# Patient Record
Sex: Female | Born: 1990 | Race: Black or African American | Hispanic: No | Marital: Single | Smoking: Current every day smoker
Health system: Southern US, Community
[De-identification: ages and names within clinical notes are randomized; demographics above are authoritative.]

## PROBLEM LIST (undated history)

## (undated) DIAGNOSIS — N83201 Unspecified ovarian cyst, right side: Secondary | ICD-10-CM

## (undated) DIAGNOSIS — F419 Anxiety disorder, unspecified: Secondary | ICD-10-CM

## (undated) DIAGNOSIS — J45909 Unspecified asthma, uncomplicated: Secondary | ICD-10-CM

## (undated) DIAGNOSIS — F32A Depression, unspecified: Secondary | ICD-10-CM

## (undated) DIAGNOSIS — N83202 Unspecified ovarian cyst, left side: Secondary | ICD-10-CM

## (undated) DIAGNOSIS — F329 Major depressive disorder, single episode, unspecified: Secondary | ICD-10-CM

## (undated) DIAGNOSIS — G43719 Chronic migraine without aura, intractable, without status migrainosus: Secondary | ICD-10-CM

## (undated) HISTORY — PX: OTHER SURGICAL HISTORY: SHX169

## (undated) HISTORY — DX: Depression, unspecified: F32.A

## (undated) HISTORY — DX: Anxiety disorder, unspecified: F41.9

## (undated) HISTORY — DX: Major depressive disorder, single episode, unspecified: F32.9

## (undated) HISTORY — PX: WISDOM TOOTH EXTRACTION: SHX21

---
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
## 2014-04-23 ENCOUNTER — Encounter (HOSPITAL_COMMUNITY): Payer: Self-pay | Admitting: *Deleted

## 2014-04-23 ENCOUNTER — Emergency Department (HOSPITAL_COMMUNITY): Payer: Worker's Compensation

## 2014-04-23 ENCOUNTER — Emergency Department (HOSPITAL_COMMUNITY)
Admission: EM | Admit: 2014-04-23 | Discharge: 2014-04-23 | Disposition: A | Discharge status: Home / Self Care | Payer: Worker's Compensation | Attending: Emergency Medicine | Admitting: Emergency Medicine

## 2014-04-23 DIAGNOSIS — R112 Nausea with vomiting, unspecified: Secondary | ICD-10-CM | POA: Insufficient documentation

## 2014-04-23 DIAGNOSIS — M549 Dorsalgia, unspecified: Secondary | ICD-10-CM

## 2014-04-23 DIAGNOSIS — M545 Low back pain, unspecified: Secondary | ICD-10-CM

## 2014-04-23 DIAGNOSIS — Z3202 Encounter for pregnancy test, result negative: Secondary | ICD-10-CM | POA: Diagnosis not present

## 2014-04-23 DIAGNOSIS — J45909 Unspecified asthma, uncomplicated: Secondary | ICD-10-CM | POA: Diagnosis not present

## 2014-04-23 HISTORY — DX: Unspecified asthma, uncomplicated: J45.909

## 2014-04-23 LAB — URINALYSIS, ROUTINE W REFLEX MICROSCOPIC
Bilirubin Urine: NEGATIVE
Glucose, UA: NEGATIVE mg/dL
Hgb urine dipstick: NEGATIVE
Ketones, ur: NEGATIVE mg/dL
Leukocytes, UA: NEGATIVE
Nitrite: NEGATIVE
Protein, ur: NEGATIVE mg/dL
Specific Gravity, Urine: 1.023 (ref 1.005–1.030)
Urobilinogen, UA: 0.2 mg/dL (ref 0.0–1.0)
pH: 5.5 (ref 5.0–8.0)

## 2014-04-23 LAB — PREGNANCY, URINE: Preg Test, Ur: NEGATIVE

## 2014-04-23 MED ORDER — OXYCODONE-ACETAMINOPHEN 5-325 MG PO TABS
2.0000 | ORAL_TABLET | Freq: Once | ORAL | Status: AC
  Administered 2014-04-23: 2 via ORAL
  Filled 2014-04-23: qty 2

## 2014-04-23 NOTE — ED Notes (Signed)
The pt is c/o lower back pain n v since yesterday  lmp nov she may be oreg

## 2014-04-23 NOTE — ED Notes (Signed)
Patient transported to X-ray 

## 2014-04-23 NOTE — Discharge Instructions (Signed)
°Emergency Department Resource Guide °1) Find a Doctor and Pay Out of Pocket °Although you won't have to find out who is covered by your insurance plan, it is a good idea to ask around and get recommendations. You will then need to call the office and see if the doctor you have chosen will accept you as a new patient and what types of options they offer for patients who are self-pay. Some doctors offer discounts or will set up payment plans for their patients who do not have insurance, but you will need to ask so you aren't surprised when you get to your appointment. ° °2) Contact Your Local Health Department °Not all health departments have doctors that can see patients for sick visits, but many do, so it is worth a call to see if yours does. If you don't know where your local health department is, you can check in your phone book. The CDC also has a tool to help you locate your state's health department, and many state websites also have listings of all of their local health departments. ° °3) Find a Walk-in Clinic °If your illness is not likely to be very severe or complicated, you may want to try a walk in clinic. These are popping up all over the country in pharmacies, drugstores, and shopping centers. They're usually staffed by nurse practitioners or physician assistants that have been trained to treat common illnesses and complaints. They're usually fairly quick and inexpensive. However, if you have serious medical issues or chronic medical problems, these are probably not your best option. ° °No Primary Care Doctor: °- Call [facility] at  832-8000 - they can help you locate a primary care doctor that  accepts your insurance, provides certain services, etc. °- Physician Referral Service- 1-800-533-3463 ° °Chronic Pain Problems: °Organization         Address  Phone   Notes  °[facility]  (336) 297-2271 Patients need to be referred by their primary care doctor.  ° °Medication  Assistance: °Organization         Address  Phone   Notes  °[place] [facility] [address] °[place], [place] [address] (336) 641-8030 --Must be a resident of [place] °-- Must have NO insurance coverage whatsoever (no Medicaid/ Medicare, etc.) °-- The pt. MUST have a primary care doctor that directs their care regularly and follows them in the community °  °[facility]  (866) 331-1348   °[employer]  (888) 892-1162   ° °Agencies that provide inexpensive medical care: °Organization         Address  Phone   Notes  °[facility]  (336) 832-8035   °[facility]    (336) 832-7272   °[facility] [address] °[place], [place] [address] (336) 832-4777   °[facility] [address], °[place] (336) 271-4999   °[facility]    (336) 373-0678   °[facility]    (336) 272-1050   °[facility] ° [address], [place] Phone:  (336) 832-4444, Fax:  (336) 832-4440 Hours of Operation:  9 am - 6 pm, M-F.  Also accepts Medicaid/Medicare and self-pay.  °[facility] ° [address], [place] Phone: (336) 832-3150, Fax: (336) 832-3151. Hours of Operation:  8:30 am - 5:30 pm, M-F.  Also accepts Medicaid and self-pay.  °[facility] [place] [address], [place] Phone: (336) 878-6027   °[facility] [address], [place], [place] (336)723-1848, Ext. 123 Mondays & Thursdays: 7-9 AM.  First 15 patients are seen on a first come, first serve basis. °  ° °Medicaid-accepting [place] Providers: ° °Organization         Address  Phone   Notes  °[facility] [address], [place] (336) 641-2100 Also accepts self-pay patients.  °[facility] [address], [place] ° (336) 856-9996   °[facility] [address], [place]  (336) 288-8857   °[facility] [address] [place] [address], [place] (336) 299-7000   °Veita Bland [address], [place]  ° (336) 373-1557 Only accepts [place] Access Medicaid patients after they have their name applied to their card.  ° °Self-Pay (no insurance) in [place]: ° °Organization         Address  Phone   Notes  °Sickle Cell Patients, [facility] [address], [place] (336) 832-1970   °[facility] [address], [place] (336) 832-4400   °[facility] ° [address], [place] (336) 992-4800   °[facility]/Dr. Osei-Bonsu ° [address] [place] [address], [place] or [address], [place] (336) 841-8500 Phone number for both [place] and [place] locations is the same.  °[facility] [address], [place] (336) 299-0000   °[facility] [address] [place] [address], [place] or [address] (336) 852-7530 °(336) 878-2260   °[facility] [address], [place] (336) 350-1642, phone; (336) 294-5005, fax Sees patients 1st and 3rd Saturday of every month.  Must not qualify for public or private insurance (i.e. Medicaid, Medicare, [place] Health Choice, Veterans' Benefits) • Household income should be no more than 200% of the poverty level •The clinic cannot treat you if you are pregnant or think you are pregnant • Sexually transmitted diseases are not treated at the clinic.  ° ° °Dental Care: °Organization         Address  Phone  Notes  °[place] [facility] [address], [place] (336) 641-6152 Accepts children up to age 21 who are enrolled in Medicaid or [place] Health Choice; pregnant women with a Medicaid card; and children who have applied for Medicaid or [place] Health Choice, but were declined, whose parents can pay a reduced fee at time of service.  °[place]  [facility] [place]  [address], [place] (336) 641-7733 Accepts children up to age 21 who are enrolled in Medicaid or [place] Health Choice; pregnant women with a Medicaid card; and children who have applied for Medicaid or [place] Health Choice, but were declined, whose parents can pay a reduced fee at time of service.  °[facility] ° [address], [place] (336) 641-4533 Patients are seen by appointment only. Walk-ins are not accepted. [facility] will see patients 18 years of age and older. °Monday - Tuesday (8am-5pm) °Most Wednesdays (8:30-5pm) °$30 per visit, cash only  °[facility] ° [address], [place] (336) 641-4533 Patients are seen by appointment only. Walk-ins are not accepted. [facility] will see patients 18 years of age and older. °One   Wednesday Evening (Monthly: Volunteer Based).  $30 per visit, cash only  °[facility]  (919) 537-3737 for adults; Children under age 4, call Graduate Pediatric Dentistry at (919) [phone]. Children aged 4-14, please call (919) [phone] to request a pediatric application. ° Dental services are provided in all areas of dental care including fillings, crowns and bridges, complete and partial dentures, implants, gum treatment, root canals, and extractions. Preventive care is also provided. Treatment is provided to both adults and children. °Patients are selected via a lottery and there is often a waiting list. °  °[facility] [address], °[place] ° (336) 763-8833 [email or web address] °  °[facility] [address], [place], [place] (336)723-1848, Ext. 123 Second and Fourth Thursday of each month, opens at 6:30 AM; Clinic ends at 9 AM.  Patients are seen on a first-come first-served basis, and a limited number are seen during each clinic.  ° °[facility] ° [address], [place], [place] (336) 723-7904    Eligibility Requirements °You must have lived in [place], [place], or [place] counties for at least the last three months. °  You cannot be eligible for state or federal sponsored healthcare insurance, including Veterans Administration, Medicaid, or Medicare. °  You generally cannot be eligible for healthcare insurance through your employer.  °  How to apply: °Eligibility screenings are held every Tuesday and Wednesday afternoon from 1:00 pm until 4:00 pm. You do not need an appointment for the interview!  °[facility] [address], [place], [place] 336-631-2330   °[facility]  336-342-8273   °[facility]  336-703-3100   °[facility]  336-570-6415   ° °Behavioral Health Resources in the Community: °Intensive Outpatient Programs °Organization         Address  Phone  Notes  °[facility] [address], [place], [place] 336-878-6098   °[facility] [address], [place], [place] 336-832-9800   °[facility] [address], [place], [place] ° 336-882-2125   °[facility] [address],  °[place], [place] 1-800-853-5163 or 336-641-4981   °Substance Abuse Resources °Organization         Address  Phone  Notes  °[facility]  336-882-2125   °[facility]  336-784-9470   °[facility]  336-285-9073   °[facility]  336-845-3988   °Residential & Outpatient Substance Abuse Program  1-800-659-3381   °Psychological Services °Organization         Address  Phone  Notes  °[facility]  336- 832-9600   °[facility]  336- 378-7881   °[facility] [address], [place] 1-800-853-5163 or 336-641-4981   ° °Mobile Crisis Teams °Organization         Address  Phone  Notes  °[facility], Mobile Crisis Care Unit  1-877-626-1772   °Assertive °[facility] ° [address]  [place], [place] 336-834-9664   °Sharon DeEsch [address] °[place] 336-554-5454   ° °Self-Help/Support Groups °Organization         Address  Phone             Notes  °[facility]  - variety of support groups  336- 373-1402 Call for more information  °Narcotics Anonymous (NA), [facility] [address], °[place]  2 meetings at this location  ° °  Residential Treatment Programs °Organization         Address  Phone  Notes  °[facility] [address],    °[place]  1-866-801-8205   °[facility] ° [address], [place], [place] 704-293-8524   °[facility] [address], [place] 336-845-3988 Admissions: 8am-3pm M-F  °[facility] [address]    °[place], [place] 336-841-1104   °[facility] [address], [place], [place] 336-379-7146   °[facility] [address]  °[place], [place] 336-285-9073   °[facility] Intensive Outpatient [address], [place], [place] 336-852-3033   °[facility] [address]  °[place], [place] 1-877-615-2722 or 336-784-9470   °[facility] [address] [place], [place] 336-227-7417 Accepts Medicaid  °[facility] [address]  °[place] 1-800-659-3381 Substance Abuse/Addiction Treatment  ° °[place] Behavioral Health Resources °Organization         Address  Phone  Notes  °[facility]  (888) 581-9988   °Julie Brannon, PhD [address] [place], [place]   (336) 349-5553 or (336) 951-0000   °[facility]   [address] °[place], [place] (336) 349-4454   °[facility] [address], [place], [place] (336) 342-8316 Insurance/Medicaid/sponsorship through [facility]  °[facility] [address]                                    [place], [place] (336) 342-8316 Therapy/tele-psych/case    °[facility] [address]  ° [place], [place] (336) 349-2233    °Dr. Arfeen  (336) 349-4544   °[facility] [place]  [employer] [place] [facility] 1) [address],  °2) [address], [place] °3)  [address], [place] (336) 349-3220 °(336) 342-7768 ° °(336) 342-8140   °[place] [facility] (336) 342-1394 or (336) 342-3537 (After Hours)    ° ° ° °Take your usual prescriptions as previously directed.  Apply moist heat or ice to the area(s) of discomfort, for 15 minutes at a time, several times per day for the next few days.  Do not fall asleep on a heating or ice pack.  Call your regular medical doctor on Monday to schedule a follow up appointment in the next 2 days.  Return to the Emergency Department immediately if worsening. ° °

## 2014-04-23 NOTE — ED Provider Notes (Signed)
CSN: [id number]     Arrival date & time 04/23/14  0256 History   First MD Initiated Contact with Patient 04/23/14 [phone]     Chief Complaint  Patient presents with  . Back Pain      HPI Pt was seen at 0345. Per pt, c/o gradual onset and persistence of constant right sided low back "pain" for the past 3 days. 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 Pt was evaluated by her Occupational Health MD, rx muscle relaxer, anti-inflammatory, and narcotic pain medication. Pain worsens with palpation of the area and body position changes. Pt states the right side of her lower back "feels numb" and she had an episode of N/V yesterday, so she came to the ED for further evaluation. Pt states she "might be pregnant" because her LMP was 3 months ago. Pt has not taken a home pregnancy test. Denies incont/retention of bowel or bladder, no saddle anesthesia, no focal motor weakness, no tingling/numbness in extremities, no fevers, no new injury, no abd pain.     Past Medical History  Diagnosis Date  . Asthma    History reviewed. No pertinent past surgical history.  History  Substance Use Topics  . Smoking status: Never Smoker   . Smokeless tobacco: Not on file  . Alcohol Use: No    Review of Systems ROS: Statement: All systems negative except as marked or noted in the HPI; Constitutional: Negative for fever and chills. ; ; Eyes: Negative for eye pain, redness and discharge. ; ; ENMT: Negative for ear pain, hoarseness, nasal congestion, sinus pressure and sore throat. ; ; Cardiovascular: Negative for chest pain, palpitations, diaphoresis, dyspnea and peripheral edema. ; ; Respiratory: Negative for cough, wheezing and stridor. ; ; Gastrointestinal: +N/V. Negative for diarrhea, abdominal pain, blood in stool, hematemesis, jaundice and rectal bleeding. . ; ; Genitourinary: Negative for dysuria, flank pain and hematuria. ; ; Musculoskeletal: +LBP. Negative for neck pain.  Negative for swelling and trauma.; ; Skin: Negative for pruritus, rash, abrasions, blisters, bruising and skin lesion.; ; Neuro: Negative for headache, lightheadedness and neck stiffness. Negative for weakness, altered level of consciousness , altered mental status, extremity weakness, involuntary movement, seizure and syncope.     Allergies  Review of patient's allergies indicates no known allergies.  Home Medications   Prior to Admission medications   Not on File   BP 116/73 mmHg  Pulse 65  Temp(Src) 98.3 F (36.8 C) (Oral)  Resp 22  Ht 5\' 9"  (1.753 m)  Wt 240 lb (108.863 kg)  BMI 35.43 kg/m2  SpO2 100%  LMP 02/10/2014 Physical Exam  0350; Physical examination:  Nursing notes reviewed; Vital signs and O2 SAT reviewed;  Constitutional: Well developed, Well nourished, Well hydrated, In no acute distress; Head:  Normocephalic, atraumatic; Eyes: EOMI, PERRL, No scleral icterus; ENMT: Mouth and pharynx normal, Mucous membranes moist; Neck: Supple, Full range of motion, No lymphadenopathy; Cardiovascular: Regular rate and rhythm, No murmur, rub, or gallop; Respiratory: Breath sounds clear & equal bilaterally, No rales, rhonchi, wheezes.  Speaking full sentences with ease, Normal respiratory effort/excursion; Chest: Nontender, Movement normal; Abdomen: Soft, Nontender, Nondistended, Normal bowel sounds; Genitourinary: No CVA tenderness; Spine:  No midline CS, TS, LS tenderness. +TTP right lumbar paraspinal muscles. No rash.;; Extremities: Pulses normal, No tenderness, No edema, No calf edema or asymmetry.; Neuro: AA&Ox3, Major CN grossly intact.  Speech clear. No gross focal motor or sensory deficits in extremities. Strength 5/5  equal bilat UE's and LE's, including great toe dorsiflexion.  DTR 2/4 equal bilat UE's and LE's.  No gross sensory deficits.  Neg straight leg raises bilat.; Skin: Color normal, Warm, Dry.   ED Course  Procedures     EKG Interpretation None      MDM   MDM Reviewed: nursing note and vitals Interpretation: x-ray and labs      Results for orders placed or performed during the hospital encounter of 04/23/14  Pregnancy, urine  Result Value Ref Range   Preg Test, Ur NEGATIVE NEGATIVE  Urinalysis, Routine w reflex microscopic  Result Value Ref Range   Color, Urine YELLOW YELLOW   APPearance CLEAR CLEAR   Specific Gravity, Urine 1.023 1.005 - 1.030   pH 5.5 5.0 - 8.0   Glucose, UA NEGATIVE NEGATIVE mg/dL   Hgb urine dipstick NEGATIVE NEGATIVE   Bilirubin Urine NEGATIVE NEGATIVE   Ketones, ur NEGATIVE NEGATIVE mg/dL   Protein, ur NEGATIVE NEGATIVE mg/dL   Urobilinogen, UA 0.2 0.0 - 1.0 mg/dL   Nitrite NEGATIVE NEGATIVE   Leukocytes, UA NEGATIVE NEGATIVE   Dg Lumbar Spine Complete 04/23/2014   CLINICAL DATA:  Bowel back popped while pushing resident in a stretcher. Pain radiates to RIGHT leg and RIGHT arm.  EXAM: LUMBAR SPINE - COMPLETE 4+ VIEW  COMPARISON:  None.  FINDINGS: Five non rib-bearing lumbar-type vertebral bodies are intact and aligned with maintenance of the lumbar lordosis. Intervertebral disc heights are normal. 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 No pars interarticularis defects. No destructive bony lesions.  Sacroiliac joints are symmetric. Included prevertebral and paraspinal soft tissue planes are non-suspicious.  IMPRESSION: Negative.   Electronically Signed   By: Awilda Metro   On: 04/23/2014 05:31    4098:  Workup reassuring. No red flags on HPI or PE. Feels better after pain meds and wants to go home now. States she "has enough" prescriptions at home and does not need any more meds. Dx and testing d/w pt and family.  Questions answered.  Verb understanding, agreeable to d/c home with outpt f/u.   Samuel Jester, DO 04/26/14 1308

## 2015-12-25 ENCOUNTER — Encounter (HOSPITAL_COMMUNITY): Payer: Self-pay

## 2015-12-25 ENCOUNTER — Emergency Department (HOSPITAL_COMMUNITY)
Admission: EM | Admit: 2015-12-25 | Discharge: 2015-12-25 | Disposition: A | Discharge status: Home / Self Care | Payer: Self-pay | Attending: Emergency Medicine | Admitting: Emergency Medicine

## 2015-12-25 DIAGNOSIS — K029 Dental caries, unspecified: Secondary | ICD-10-CM | POA: Insufficient documentation

## 2015-12-25 DIAGNOSIS — K0889 Other specified disorders of teeth and supporting structures: Secondary | ICD-10-CM

## 2015-12-25 DIAGNOSIS — J45909 Unspecified asthma, uncomplicated: Secondary | ICD-10-CM | POA: Insufficient documentation

## 2015-12-25 MED ORDER — HYDROCODONE-ACETAMINOPHEN 5-325 MG PO TABS
1.0000 | ORAL_TABLET | Freq: Once | ORAL | Status: AC
Start: 2015-12-25 — End: 2015-12-25
  Administered 2015-12-25: 1 via ORAL
  Filled 2015-12-25: qty 1

## 2015-12-25 MED ORDER — BENZOCAINE 10 % MT GEL: 1.0000 "application " | Freq: Four times a day (QID) | OROMUCOSAL | 0 refills | Status: DC | PRN

## 2015-12-25 MED ORDER — PENICILLIN V POTASSIUM 250 MG PO TABS
500.0000 mg | ORAL_TABLET | Freq: Once | ORAL | Status: AC
  Administered 2015-12-25: 500 mg via ORAL
  Filled 2015-12-25: qty 2

## 2015-12-25 MED ORDER — PENICILLIN V POTASSIUM 500 MG PO TABS: 500.0000 mg | ORAL_TABLET | Freq: Four times a day (QID) | ORAL | 0 refills | Status: AC

## 2015-12-25 NOTE — Discharge Instructions (Signed)
Read the information below.  You are being prescribed an antibiotic. Please take as directed.  I also prescribed Orajel. You can apply topically to the affected tooth up to 4 times daily.  Continue to take either Tylenol 650 mg every 6 hours or Motrin for her milligrams every 6 hours for pain relief. You can apply ice pack to affected area for 20 minute increments.  I provided the contact information for dentists. Please call in the morning to schedule a follow-up appointment. Use the prescribed medication as directed.  Please discuss all new medications with your pharmacist.   You may return to the Emergency Department at any time for worsening condition or any new symptoms that concern you. Return to the emergency department if you develop a fever, trouble swallowing, trouble breathing or unable to open your mouth.

## 2015-12-25 NOTE — ED Triage Notes (Signed)
Patient here with right lower dental pain x 1 week that has worsened the past 2 days, pain radiating alomg jaw line to right ear

## 2015-12-25 NOTE — ED Provider Notes (Signed)
[facility]-EMERGENCY DEPT Provider Note   CSN: [id number] Arrival date & time: 12/25/15  1826  By signing my name below, I, Nelwyn Salisbury, attest that this documentation has been prepared under the direction and in the presence of non-physician practitioner, Arvilla Meres, PA-C.Marland Kitchen Electronically Signed: Nelwyn Salisbury, Scribe. 12/27/2015. 5:33 PM.  History   Chief Complaint Chief Complaint  Patient presents with  . Dental Pain    The history is provided by the patient. No language interpreter was used.    HPI Comments:  Kayla Owens is a 25 y.o. female who presents to the Emergency Department complaining of sudden-onset constant unchanged right-lower dental pain beginning 2 weeks ago. The pt describes her symptoms as a pulsating pain that radiates up to her ear. Pt has tried tylenol, excedrin, and naproxen with no relief. Her pain is exacerbated by eating. She endorses associated nausea, sinus pressure when laying back, facial swelling. She denies any fever, neck pain, trouble swallowing, headache, or shortness of breath.   Past Medical History:  Diagnosis Date  . Asthma     There are no active problems to display for this patient.   History reviewed. No pertinent surgical history.  OB History    No data available       Home Medications    Prior to Admission medications   Medication Sig Start Date End Date Taking? Authorizing Provider  benzocaine (ORAJEL) 10 % mucosal gel Use as directed 1 application in the mouth or throat 4 (four) times daily as needed for mouth pain. 12/25/15   Lona Kettle, PA-C  penicillin v potassium (VEETID) 500 MG tablet Take 1 tablet (500 mg total) by mouth 4 (four) times daily. 12/25/15 01/01/16  Lona Kettle, PA-C    Family History No family history on file.  Social History Social History  Substance Use Topics  . Smoking status: Never Smoker  . Smokeless tobacco: Never Used  . Alcohol use No     Allergies   Review of patient's  allergies indicates no known allergies.   Review of Systems Review of Systems  Constitutional: Negative for fever.  HENT: Positive for dental problem, facial swelling and sinus pressure. Negative for trouble swallowing.   Respiratory: Negative for shortness of breath.   Gastrointestinal: Positive for nausea.  Musculoskeletal: Negative for neck pain.  Neurological: Negative for headaches.     Physical Exam Updated Vital Signs BP 144/95 (BP Location: Left Arm) Comment (BP Location): BP taken in pt's forearm  Pulse 70   Temp 98.2 F (36.8 C) (Oral)   Resp 18   SpO2 100%   Physical Exam  Constitutional: She appears well-developed and well-nourished. No distress.  HENT:  Head: Normocephalic and atraumatic.  Right Ear: Tympanic membrane, external ear and ear canal normal.  Left Ear: Tympanic membrane, external ear and ear canal normal.  Nose: Nose normal.  Mouth/Throat: Uvula is midline, oropharynx is clear and moist and mucous membranes are normal. No trismus in the jaw. Abnormal dentition. Dental caries present. No uvula swelling. No tonsillar exudate.  No obvious facial swelling appreciated. Disrupted filling at tooth 29. TTP of tooth 29 with associated TTP of gingiva. No trismus. No sublingual pain or swelling. No obvious abscess. Patient managing oral secretions.   Eyes: Conjunctivae are normal. Right eye exhibits no discharge. Left eye exhibits no discharge. No scleral icterus.  Neck: Normal range of motion and phonation normal. Neck supple. No neck rigidity.  No nuchal rigidity.   Cardiovascular: Normal rate and intact distal  pulses.   Pulmonary/Chest: Effort normal. No stridor. No respiratory distress.  Abdominal: Soft. There is no tenderness.  Musculoskeletal: She exhibits no edema.  Lymphadenopathy:    She has no cervical adenopathy.  Neurological: She is alert.  Skin: Skin is warm and dry. She is not diaphoretic.  Psychiatric: She has a normal mood and affect. Her  behavior is normal.  Nursing note and vitals reviewed.  ED Treatments / Results  DIAGNOSTIC STUDIES:  Oxygen Saturation is 96% on RA, normal by my interpretation.    COORDINATION OF CARE:  9:21 PM Discussed treatment plan with pt at bedside which includes abx, OTC painkillers, and benzocaine gel and pt agreed to plan.  Labs (all labs ordered are listed, but only abnormal results are displayed) Labs Reviewed - No data to display  EKG  EKG Interpretation None       Radiology No results found.  Procedures Procedures (including critical care time)  Medications Ordered in ED Medications  penicillin v potassium (VEETID) tablet 500 mg (500 mg Oral Given 12/25/15 2145)  HYDROcodone-acetaminophen (NORCO/VICODIN) 5-325 MG per tablet 1 tablet (1 tablet Oral Given 12/25/15 2145)     Initial Impression / Assessment and Plan / ED Course  I have reviewed the triage vital signs and the nursing notes.  Pertinent labs & imaging results that were available during my care of the patient were reviewed by me and considered in my medical decision making (see chart for details).  Clinical Course    Patient presents to ED with dental pain. Patient is afebrile and non-toxic appearing in NAD. VSS. Disrupted filling at tooth 29 with associated TTP and TTP of underlying gingiva. No trismus. Uvula midline. No sublingual swelling or TTP. No nuchal rigidity. Pt managing oral secretions. Patient with toothache.  No gross abscess.  Exam unconcerning for Ludwig's angina or spread of infection.  Pain managed in ED. Rx PCN and oragel. Symptomatic management discussed. Urged patient to follow-up with dentist, contact information provided. Return precautions given. Pt voiced understanding and is agreeable.    Final Clinical Impressions(s) / ED Diagnoses   Final diagnoses:  Pain, dental    New Prescriptions Discharge Medication List as of 12/25/2015  9:35 PM    START taking these medications   Details    benzocaine (ORAJEL) 10 % mucosal gel Use as directed 1 application in the mouth or throat 4 (four) times daily as needed for mouth pain., Starting Tue 12/25/2015, Print    penicillin v potassium (VEETID) 500 MG tablet Take 1 tablet (500 mg total) by mouth 4 (four) times daily., Starting Tue 12/25/2015, Until Tue 01/01/2016, Print      I personally performed the services described in this documentation, which was scribed in my presence. The recorded information has been reviewed and is accurate.     Herminio Commonsshley Laurel Sulphur SpringsMeyer, [place] JerseyPA-C 12/27/15 1738    Alvira MondayErin Schlossman, MD 12/30/15 2117

## 2016-03-17 DIAGNOSIS — Z113 Encounter for screening for infections with a predominantly sexual mode of transmission: Secondary | ICD-10-CM | POA: Diagnosis not present

## 2016-03-17 DIAGNOSIS — Z01411 Encounter for gynecological examination (general) (routine) with abnormal findings: Secondary | ICD-10-CM | POA: Diagnosis not present

## 2016-03-17 DIAGNOSIS — Z114 Encounter for screening for human immunodeficiency virus [HIV]: Secondary | ICD-10-CM | POA: Diagnosis not present

## 2016-03-17 DIAGNOSIS — Z1159 Encounter for screening for other viral diseases: Secondary | ICD-10-CM | POA: Diagnosis not present

## 2016-03-17 DIAGNOSIS — Z118 Encounter for screening for other infectious and parasitic diseases: Secondary | ICD-10-CM | POA: Diagnosis not present

## 2016-03-17 DIAGNOSIS — N971 Female infertility of tubal origin: Secondary | ICD-10-CM | POA: Diagnosis not present

## 2016-03-17 DIAGNOSIS — Z6841 Body Mass Index (BMI) 40.0 and over, adult: Secondary | ICD-10-CM | POA: Diagnosis not present

## 2016-03-27 DIAGNOSIS — G43839 Menstrual migraine, intractable, without status migrainosus: Secondary | ICD-10-CM | POA: Diagnosis not present

## 2016-03-27 DIAGNOSIS — Z049 Encounter for examination and observation for unspecified reason: Secondary | ICD-10-CM | POA: Diagnosis not present

## 2016-03-27 DIAGNOSIS — Z79899 Other long term (current) drug therapy: Secondary | ICD-10-CM | POA: Diagnosis not present

## 2016-03-27 DIAGNOSIS — G43719 Chronic migraine without aura, intractable, without status migrainosus: Secondary | ICD-10-CM | POA: Diagnosis not present

## 2016-03-27 DIAGNOSIS — R51 Headache: Secondary | ICD-10-CM | POA: Diagnosis not present

## 2016-03-27 DIAGNOSIS — G43111 Migraine with aura, intractable, with status migrainosus: Secondary | ICD-10-CM | POA: Diagnosis not present

## 2016-06-23 DIAGNOSIS — N971 Female infertility of tubal origin: Secondary | ICD-10-CM | POA: Diagnosis not present

## 2016-06-23 DIAGNOSIS — N7011 Chronic salpingitis: Secondary | ICD-10-CM | POA: Diagnosis not present

## 2016-06-23 DIAGNOSIS — N736 Female pelvic peritoneal adhesions (postinfective): Secondary | ICD-10-CM | POA: Diagnosis not present

## 2016-06-23 DIAGNOSIS — Z319 Encounter for procreative management, unspecified: Secondary | ICD-10-CM | POA: Diagnosis not present

## 2016-06-23 DIAGNOSIS — Z3161 Procreative counseling and advice using natural family planning: Secondary | ICD-10-CM | POA: Diagnosis not present

## 2016-09-17 ENCOUNTER — Ambulatory Visit (HOSPITAL_COMMUNITY)
Admission: EM | Admit: 2016-09-17 | Discharge: 2016-09-17 | Disposition: A | Discharge status: Home / Self Care | Payer: 59 | Attending: Family Medicine | Admitting: Family Medicine

## 2016-09-17 ENCOUNTER — Encounter (HOSPITAL_COMMUNITY): Payer: Self-pay | Admitting: Emergency Medicine

## 2016-09-17 DIAGNOSIS — Z79899 Other long term (current) drug therapy: Secondary | ICD-10-CM | POA: Insufficient documentation

## 2016-09-17 DIAGNOSIS — N898 Other specified noninflammatory disorders of vagina: Secondary | ICD-10-CM | POA: Diagnosis not present

## 2016-09-17 MED ORDER — METRONIDAZOLE 500 MG PO TABS: 500.0000 mg | ORAL_TABLET | Freq: Two times a day (BID) | ORAL | 0 refills | Status: AC

## 2016-09-17 NOTE — ED Triage Notes (Signed)
[facility] a complaint of a vaginal discharge and odor after unprotected sex 2 weeks ago.

## 2016-09-17 NOTE — ED Provider Notes (Signed)
CSN: [id number]     Arrival date & time 09/17/16  1859 History     Chief Complaint  Patient presents with  . Vaginal Discharge    Patient is a 26 yo female with 4-5 day history of vaginal discharge with odor. States she had unprotected sex 2 weeks ago with her monogamous partner. She had her cycle shortly after. She thought discharge was due to her cycle when it first started. With increase in yellow/off white discharge and a foul odor, she would like to be tested for STDs. Denies vaginal pain, itching, irritation. Denies fever, abdominal pain, changes in bowel movement. Denies burning on urination, urinary frequency.        Past Medical History:  Diagnosis Date  . Asthma   . Hx of migraines    History reviewed. No pertinent surgical history. History reviewed. No pertinent family history. Social History  Substance Use Topics  . Smoking status: Never Smoker  . Smokeless tobacco: Never Used  . Alcohol use No   OB History    No data available     Review of Systems  Constitutional: Negative for chills, diaphoresis and fever.  Respiratory: Negative for cough, shortness of breath and wheezing.   Cardiovascular: Negative for chest pain and palpitations.  Gastrointestinal: Negative for abdominal distention, abdominal pain, blood in stool, constipation, diarrhea, nausea and vomiting.  Genitourinary: Positive for vaginal discharge. Negative for difficulty urinating, dyspareunia, dysuria, flank pain, frequency, genital sores, menstrual problem, pelvic pain, urgency, vaginal bleeding and vaginal pain.    Allergies  Patient has no known allergies.  Home Medications   Prior to Admission medications   Medication Sig Start Date End Date Taking? Authorizing Provider  baclofen (LIORESAL) 10 MG tablet Take 10 mg by mouth 3 (three) times daily.   Yes [provider]  tiZANidine (ZANAFLEX) 4 MG tablet Take 4 mg by mouth every 6 (six) hours as needed for muscle spasms.   Yes  [provider]  metroNIDAZOLE (FLAGYL) 500 MG tablet Take 1 tablet (500 mg total) by mouth 2 (two) times daily. 09/17/16 09/24/16  Belinda Fisher, PA-C   Meds Ordered and Administered this Visit  Medications - No data to display  BP (!) 147/71 (BP Location: Right Arm)   Temp 97.6 F (36.4 C) (Oral)   Resp 18   LMP 09/11/2016 (Exact Date)   SpO2 100%  No data found.   Physical Exam  Constitutional: She is oriented to person, place, and time. She appears well-developed and well-nourished. No distress.  HENT:  Head: Normocephalic and atraumatic.  Eyes: Conjunctivae are normal. Pupils are equal, round, and reactive to light.  Neck: Neck supple. No thyromegaly present.  Cardiovascular: Normal rate and regular rhythm.  Exam reveals no gallop and no friction rub.   No murmur heard. Pulmonary/Chest: Effort normal and breath sounds normal. She has no wheezes.  Abdominal: Soft. Bowel sounds are normal. She exhibits no distension and no mass. There is no tenderness. There is no rebound and no guarding.  Neurological: She is alert and oriented to person, place, and time.  Skin: Skin is warm and dry.  Psychiatric: She has a normal mood and affect. Her behavior is normal. Judgment normal.    Urgent Care Course     Procedures (including critical care time)  Labs Review Labs Reviewed  URINE CYTOLOGY ANCILLARY ONLY    Imaging Review No results found.      MDM   1. Vaginal discharge   2. Vaginal odor  1. Start Flagyl 500mg  BID x 7 days. 2. Urine cytology collected to test for trich, BV, Chlamydia, Gonorrhea. Patient will be notified of results. Additional treatment will be added then if needed. 3. Patient to follow up if symptoms worsens, such as fever, abdominal pain, vaginal pain/bleeding etc.    Belinda FisherYu, Rayshard Schirtzinger V, PA-C 09/17/16 2024

## 2016-09-19 LAB — URINE CYTOLOGY ANCILLARY ONLY
Chlamydia: NEGATIVE
Neisseria Gonorrhea: NEGATIVE
Trichomonas: NEGATIVE

## 2016-09-22 LAB — URINE CYTOLOGY ANCILLARY ONLY: Candida vaginitis: NEGATIVE

## 2016-10-03 ENCOUNTER — Encounter: Payer: Self-pay | Admitting: Family

## 2016-10-03 ENCOUNTER — Ambulatory Visit (INDEPENDENT_AMBULATORY_CARE_PROVIDER_SITE_OTHER): Payer: 59 | Admitting: Family

## 2016-10-03 ENCOUNTER — Encounter: Payer: Self-pay | Admitting: Neurology

## 2016-10-03 VITALS — BP 128/80 | HR 67 | Temp 98.8°F | Resp 16 | Ht 69.0 in | Wt 263.0 lb

## 2016-10-03 DIAGNOSIS — J4540 Moderate persistent asthma, uncomplicated: Secondary | ICD-10-CM | POA: Insufficient documentation

## 2016-10-03 DIAGNOSIS — F329 Major depressive disorder, single episode, unspecified: Secondary | ICD-10-CM | POA: Diagnosis not present

## 2016-10-03 DIAGNOSIS — F32A Depression, unspecified: Secondary | ICD-10-CM

## 2016-10-03 DIAGNOSIS — F419 Anxiety disorder, unspecified: Secondary | ICD-10-CM

## 2016-10-03 DIAGNOSIS — G43109 Migraine with aura, not intractable, without status migrainosus: Secondary | ICD-10-CM | POA: Diagnosis not present

## 2016-10-03 MED ORDER — CITALOPRAM HYDROBROMIDE 20 MG PO TABS
20.0000 mg | ORAL_TABLET | Freq: Every day | ORAL | 1 refills | Status: DC
Start: 2016-10-03 — End: 2016-11-04

## 2016-10-03 MED ORDER — MONTELUKAST SODIUM 10 MG PO TABS: 10.0000 mg | ORAL_TABLET | Freq: Every day | ORAL | 1 refills | Status: DC

## 2016-10-03 MED FILL — MONTELUKAST SOD 10 MG TAB: 10 | 30 days supply | Qty: 30 | Fill #0

## 2016-10-03 MED FILL — CITALOPRAM HBR 20 MG TABLET: 20 | 30 days supply | Qty: 30 | Fill #0

## 2016-10-03 NOTE — Patient Instructions (Signed)
Thank you for choosing [facility].  SUMMARY AND INSTRUCTIONS:  Please start taking Celexa for anxiety and depression. If thoughts of suicide develop please stop the medication and seek care.  Start taking Singulair for your asthma and continue to use the albuterol as needed.  You receive a call regarding your referrals to neurology and psychology.  Follow-up in one month or sooner if needed.   Medication:  Your prescription(s) have been submitted to your pharmacy or been printed and provided for you. Please take as directed and contact our office if you believe you are having problem(s) with the medication(s) or have any questions.  Follow up:  If your symptoms worsen or fail to improve, please contact our office for further instruction, or in case of emergency go directly to the emergency room at the closest medical facility.     Migraine Headache A migraine headache is a very strong throbbing pain on one side or both sides of your head. Migraines can also cause other symptoms. Talk with your doctor about what things may bring on (trigger) your migraine headaches. Follow these instructions at home: Medicines  Take over-the-counter and prescription medicines only as told by your doctor.  Do not drive or use heavy machinery while taking prescription pain medicine.  To prevent or treat constipation while you are taking prescription pain medicine, your doctor may recommend that you: ? Drink enough fluid to keep your pee (urine) clear or pale yellow. ? Take over-the-counter or prescription medicines. ? Eat foods that are high in fiber. These include fresh fruits and vegetables, whole grains, and beans. ? Limit foods that are high in fat and processed sugars. These include fried and sweet foods. Lifestyle  Avoid alcohol.  Do not use any products that contain nicotine or tobacco, such as cigarettes and e-cigarettes. If you need help quitting, ask your doctor.  Get at  least 8 hours of sleep every night.  Limit your stress. General instructions   Keep a journal to find out what may bring on your migraines. For example, write down: ? What you eat and drink. ? How much sleep you get. ? Any change in what you eat or drink. ? Any change in your medicines.  If you have a migraine: ? Avoid things that make your symptoms worse, such as bright lights. ? It may help to lie down in a dark, quiet room. ? Do not drive or use heavy machinery. ? Ask your doctor what activities are safe for you.  Keep all follow-up visits as told by your doctor. This is important. Contact a doctor if:  You get a migraine that is different or worse than your usual migraines. Get help right away if:  Your migraine gets very bad.  You have a fever.  You have a stiff neck.  You have trouble seeing.  Your muscles feel weak or like you cannot control them.  You start to lose your balance a lot.  You start to have trouble walking.  You pass out (faint). This information is not intended to replace advice given to you by your health care provider. Make sure you discuss any questions you have with your health care provider. Document Released: 01/01/2008 Document Revised: 10/12/2015 Document Reviewed: 09/10/2015 Elsevier Interactive Patient Education  2017 [employer]

## 2016-10-03 NOTE — Progress Notes (Signed)
Subjective:    Patient ID: Kayla Owens, female    DOB: April 30, 1990, 26 y.o.   MRN: [id number]  Chief Complaint  Patient presents with  . Establish Care    anxiety and depression, popping in chest when trying to hold head back, menstrual cramps     HPI:  Kayla CarasJanasia Ates is a 26 y.o. female who  has a past medical history of Asthma; Chicken pox; and migraines. and presents today for an office visit to establish care.   1.) Anxiety and depression - This is a new problem. Associated symptom of anxiety and episodes of crying have been going on for about 2-3 years. In December 2016 she experienced an episode of suicidal ideation. No current ideations or hallucinations. She improved from that situation speaking with her preacher. Has never been on medication before. Sleep is described as terrible and averaging about 3-4 hours per night. Eating and drinking okay, but does not a poor appetite.    2.) Asthma - Currently maintained on albuterol inhaler and nebulizers. Reports taking the medication as prescribed and denies adverse side effects. Using the albuterol 3x per week on average and the nebulizer infrequently. Does get awoken at night secondary to symptoms about month. Previously prescribed Advair which did not help and Singulair which did help. No recent oral steroids.   3.) Migraines - Started at age 26 years. Averaging about 10 per month or more and currently [facility] and maintainedt on Baclofen and Zonisamide and transitioning off of the Zanafle which are not currently helping very much. Headaches described as throbbing with sensitivity to light and sound with associated nausea and vomiting. 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  Frequency of headaches currently about 4-5 month with about <7 days headache free on average. Previously on Toradol, amitriptyline, Topamax, and propanolol. Imaging in the done in the past has all been negative.   No  Known Allergies    Outpatient Medications Prior to Visit  Medication Sig Dispense Refill  . baclofen (LIORESAL) 10 MG tablet Take 10 mg by mouth 3 (three) times daily.    Marland Kitchen. tiZANidine (ZANAFLEX) 4 MG tablet Take 4 mg by mouth every 6 (six) hours as needed for muscle spasms.     No facility-administered medications prior to visit.      Past Medical History:  Diagnosis Date  . Asthma   . Chicken pox   . Hx of migraines       Past Surgical History:  Procedure Laterality Date  . biopsy of cervix        Family History  Problem Relation Age of Onset  . Arthritis Mother   . Hypertension Mother   . Lupus Mother   . Heart disease Maternal Grandmother   . Heart disease Maternal Grandfather   . Arthritis Paternal Grandmother   . Stomach cancer Paternal Grandfather       Social History   Social History  . Marital status: Single    Spouse name: N/A  . Number of children: 0  . Years of education: 14   Occupational History  . RMA    Social History Main Topics  . Smoking status: Current Some Day Smoker    Types: Cigarettes  . Smokeless tobacco: Never Used     Comment: < 1 cigarette per week  . Alcohol use No  . Drug use: No  . Sexual activity: Yes   Other Topics Concern  . Not on  file   Social History Narrative   Fun/Hobby: Hotel manager / pool         Review of Systems  Constitutional: Negative for chills and fever.  Respiratory: Negative for chest tightness, shortness of breath and wheezing.   Cardiovascular: Negative for chest pain, palpitations and leg swelling.  Neurological: Positive for headaches. Negative for weakness.  Psychiatric/Behavioral: Positive for dysphoric mood and sleep disturbance. Negative for behavioral problems, hallucinations, self-injury and suicidal ideas. The patient is nervous/anxious.        Objective:    BP 128/80 (BP Location: Left Arm, Patient Position: Sitting, Cuff Size: Large)   Pulse 67   Temp 98.8 F (37.1 C) (Oral)    Resp 16   Ht 5\' 9"  (1.753 m)   Wt 263 lb (119.3 kg)   LMP 09/11/2016 (Exact Date)   SpO2 98%   BMI 38.84 kg/m  Nursing note and vital signs reviewed.  Physical Exam  Constitutional: She is oriented to person, place, and time. She appears well-developed and well-nourished. No distress.  Eyes: Conjunctivae and EOM are normal. Pupils are equal, round, and reactive to light.  Neck: Neck supple.  Cardiovascular: Normal rate, regular rhythm and intact distal pulses.  Exam reveals no gallop and no friction rub.   No murmur heard. Pulmonary/Chest: Breath sounds normal. No respiratory distress. She has no wheezes. She has no rales. She exhibits no tenderness.  Lymphadenopathy:    She has no cervical adenopathy.  Neurological: She is alert and oriented to person, place, and time. No cranial nerve deficit.  Skin: Skin is warm and dry.  Psychiatric: She has a normal mood and affect. Her behavior is normal. Judgment and thought content normal.        Assessment & Plan:   Problem List Items Addressed This Visit      Cardiovascular and Mediastinum   Migraine with aura and without status migrainosus, not intractable - Primary    Received diagnosed with migraine headaches and treated by neurology. Continues to remain uncontrolled at times with current medication regimen. Previous imaging has been negative per patient and not reviewable at this time. Neurological exam is normal today. Refer to neurology per patient request. Continue current dosage of Zonegran and baclofen.       Relevant Medications   zonisamide (ZONEGRAN) 25 MG capsule   citalopram (CELEXA) 20 MG tablet   Other Relevant Orders   Ambulatory referral to Psychology     Respiratory   Moderate persistent asthma    Symptoms consistent with moderate persistent asthma with increased use of albuterol. Night time awakenings of 1-2 times per month. Restart Singulair. Continue current dosage of albuterol as needed for wheezing.  Follow up if symptoms worsen or do not improve.       Relevant Medications   albuterol (PROVENTIL HFA;VENTOLIN HFA) 108 (90 Base) MCG/ACT inhaler   montelukast (SINGULAIR) 10 MG tablet   Other Relevant Orders   Ambulatory referral to Neurology     Other   Anxiety and depression    PH Q9 score of 15 indicating moderately severe depression with no suicidal ideation. Refer to psychology for counseling. Start Celexa. Encourage stress and stress management. Follow-up in one month or sooner if symptoms worsen or do not improve.          I am having Ms. Skalsky start on citalopram and montelukast. I am also having her maintain her baclofen, tiZANidine, zonisamide, and albuterol.   Meds ordered this encounter  Medications  . zonisamide (ZONEGRAN)  25 MG capsule    Sig: Take 25 mg by mouth 4 (four) times daily.  Marland Kitchen albuterol (PROVENTIL HFA;VENTOLIN HFA) 108 (90 Base) MCG/ACT inhaler    Sig: Inhale into the lungs every 6 (six) hours as needed for wheezing or shortness of breath.  . citalopram (CELEXA) 20 MG tablet    Sig: Take 1 tablet (20 mg total) by mouth daily.    Dispense:  30 tablet    Refill:  1    Order Specific Question:   Supervising Provider    Answer:   Hillard Danker A [[id number]]  . montelukast (SINGULAIR) 10 MG tablet    Sig: Take 1 tablet (10 mg total) by mouth at bedtime.    Dispense:  30 tablet    Refill:  1    Order Specific Question:   Supervising Provider    Answer:   Hillard Danker A [[id number]]     Follow-up: Return in about 1 month (around 11/02/2016), or if symptoms worsen or fail to improve.  Jeanine Luz, FNP

## 2016-10-03 NOTE — Assessment & Plan Note (Signed)
Symptoms consistent with moderate persistent asthma with increased use of albuterol. Night time awakenings of 1-2 times per month. Restart Singulair. Continue current dosage of albuterol as needed for wheezing. Follow up if symptoms worsen or do not improve.

## 2016-10-03 NOTE — Assessment & Plan Note (Signed)
Received diagnosed with migraine headaches and treated by neurology. Continues to remain uncontrolled at times with current medication regimen. Previous imaging has been negative per patient and not reviewable at this time. Neurological exam is normal today. Refer to neurology per patient request. Continue current dosage of Zonegran and baclofen.

## 2016-10-03 NOTE — Assessment & Plan Note (Signed)
PH Q9 score of 15 indicating moderately severe depression with no suicidal ideation. Refer to psychology for counseling. Start Celexa. Encourage stress and stress management. Follow-up in one month or sooner if symptoms worsen or do not improve.

## 2016-10-06 MED FILL — CHLORHEXIDINE 0.12% RINSE: 0.12 | 17 days supply | Qty: 473 | Fill #0

## 2016-10-06 MED FILL — IBUPROFEN 600 MG TABLET: 600 | 6 days supply | Qty: 18 | Fill #0

## 2016-10-06 MED FILL — AMOXICILLIN 500 MG CAPSULE: 500 | 1 days supply | Qty: 2 | Fill #0

## 2016-10-09 MED FILL — AMOXICILLIN 500 MG CAPSULE: 500 | 7 days supply | Qty: 21 | Fill #0

## 2016-10-09 MED FILL — HYDROCODON-APAP 5-325: 5-325 | 3 days supply | Qty: 16 | Fill #0

## 2016-10-20 MED FILL — FLUCONAZOLE 150 MG TABLET: 150 | 1 days supply | Qty: 1 | Fill #0

## 2016-11-04 ENCOUNTER — Ambulatory Visit (INDEPENDENT_AMBULATORY_CARE_PROVIDER_SITE_OTHER): Payer: 59 | Admitting: Family

## 2016-11-04 ENCOUNTER — Encounter: Payer: Self-pay | Admitting: Family

## 2016-11-04 VITALS — BP 112/80 | HR 68 | Temp 98.4°F | Resp 16 | Ht 69.0 in | Wt 258.0 lb

## 2016-11-04 DIAGNOSIS — Z6838 Body mass index (BMI) 38.0-38.9, adult: Secondary | ICD-10-CM | POA: Diagnosis not present

## 2016-11-04 DIAGNOSIS — Z0000 Encounter for general adult medical examination without abnormal findings: Secondary | ICD-10-CM | POA: Insufficient documentation

## 2016-11-04 DIAGNOSIS — E6609 Other obesity due to excess calories: Secondary | ICD-10-CM

## 2016-11-04 MED ORDER — CITALOPRAM HYDROBROMIDE 20 MG PO TABS: 20.0000 mg | ORAL_TABLET | Freq: Every day | ORAL | 1 refills | Status: DC

## 2016-11-04 MED ORDER — MONTELUKAST SODIUM 10 MG PO TABS: 10.0000 mg | ORAL_TABLET | Freq: Every day | ORAL | 1 refills | Status: DC

## 2016-11-04 NOTE — Progress Notes (Signed)
Subjective:    Patient ID: Kayla CarasJanasia Davidow, female    DOB: 1990-12-08, 26 y.o.   MRN: [id number]  Chief Complaint  Patient presents with  . CPE    not fasting    HPI:  Kayla Owens is a 26 y.o. female who presents today for an annual wellness visit.   1) Health Maintenance -   Diet - Averages about 2-3 meals per day consisting of a regular diet; Denies caffeine intake.   Exercise - 3x per week; cycling and treadmill.    2) Preventative Exams / Immunizations:  Dental -- Up to date  Vision -- Up to date   Health Maintenance  Topic Date Due  . HIV Screening  06/02/2005  . INFLUENZA VACCINE  11/05/2016  . PAP SMEAR  03/11/2019  . TETANUS/TDAP  09/09/2025     There is no immunization history on file for this patient.   No Known Allergies   Outpatient Medications Prior to Visit  Medication Sig Dispense Refill  . albuterol (PROVENTIL HFA;VENTOLIN HFA) 108 (90 Base) MCG/ACT inhaler Inhale into the lungs every 6 (six) hours as needed for wheezing or shortness of breath.    . baclofen (LIORESAL) 10 MG tablet Take 10 mg by mouth 3 (three) times daily.    Marland Kitchen. zonisamide (ZONEGRAN) 25 MG capsule Take 25 mg by mouth 4 (four) times daily.    . citalopram (CELEXA) 20 MG tablet Take 1 tablet (20 mg total) by mouth daily. 30 tablet 1  . montelukast (SINGULAIR) 10 MG tablet Take 1 tablet (10 mg total) by mouth at bedtime. 30 tablet 1  . tiZANidine (ZANAFLEX) 4 MG tablet Take 4 mg by mouth every 6 (six) hours as needed for muscle spasms.     No facility-administered medications prior to visit.      Past Medical History:  Diagnosis Date  . Asthma   . Chicken pox   . Hx of migraines      Past Surgical History:  Procedure Laterality Date  . biopsy of cervix    . WISDOM TOOTH EXTRACTION       Family History  Problem Relation Age of Onset  . Arthritis Mother   . Hypertension Mother   . Lupus Mother   . Heart disease Maternal Grandmother   . Heart disease Maternal  Grandfather   . Arthritis Paternal Grandmother   . Stomach cancer Paternal Grandfather      Social History   Social History  . Marital status: Single    Spouse name: N/A  . Number of children: 0  . Years of education: 14   Occupational History  . [employer]    Social History Main Topics  . Smoking status: Current Some Day Smoker    Types: Cigarettes  . Smokeless tobacco: Never Used     Comment: < 1 cigarette per week  . Alcohol use No  . Drug use: No  . Sexual activity: Yes   Other Topics Concern  . Not on file   Social History Narrative   Fun/Hobby: Swimming / pool       Review of Systems  Constitutional: Denies fever, chills, fatigue, or significant weight gain/loss. HENT: Head: Denies headache or neck pain Ears: Denies changes in hearing, ringing in ears, earache, drainage Nose: Denies discharge, stuffiness, itching, nosebleed, sinus pain Throat: Denies sore throat, hoarseness, dry mouth, sores, thrush Eyes: Denies loss/changes in vision, pain, redness, blurry/double vision, flashing lights Cardiovascular: Denies chest pain/discomfort, tightness, palpitations, shortness of breath with activity,  difficulty lying down, swelling, sudden awakening with shortness of breath Respiratory: Denies shortness of breath, cough, sputum production, wheezing Gastrointestinal: Denies dysphasia, heartburn, change in appetite, nausea, change in bowel habits, rectal bleeding, constipation, diarrhea, yellow skin or eyes Genitourinary: Denies frequency, urgency, burning/pain, blood in urine, incontinence, change in urinary strength. Musculoskeletal: Denies muscle/joint pain, stiffness, back pain, redness or swelling of joints, trauma Skin: Denies rashes, lumps, itching, dryness, color changes, or hair/nail changes Neurological: Denies dizziness, fainting, seizures, weakness, numbness, tingling, tremor Psychiatric - Denies nervousness, stress, depression or memory loss Endocrine: Denies heat  or cold intolerance, sweating, frequent urination, excessive thirst, changes in appetite Hematologic: Denies ease of bruising or bleeding     Objective:     BP 112/80 (BP Location: Left Arm, Patient Position: Sitting, Cuff Size: Large)   Pulse 68   Temp 98.4 F (36.9 C) (Oral)   Resp 16   Ht 5\' 9"  (1.753 m)   Wt 258 lb (117 kg)   SpO2 98%   BMI 38.10 kg/m  Nursing note and vital signs reviewed.   Physical Exam  Constitutional: She is oriented to person, place, and time. She appears well-developed and well-nourished.  HENT:  Head: Normocephalic.  Right Ear: Hearing, tympanic membrane, external ear and ear canal normal.  Left Ear: Hearing, tympanic membrane, external ear and ear canal normal.  Nose: Nose normal.  Mouth/Throat: Uvula is midline, oropharynx is clear and moist and mucous membranes are normal.  Eyes: Pupils are equal, round, and reactive to light. Conjunctivae and EOM are normal.  Neck: Neck supple. No JVD present. No tracheal deviation present. No thyromegaly present.  Cardiovascular: Normal rate, regular rhythm, normal heart sounds and intact distal pulses.   Pulmonary/Chest: Effort normal and breath sounds normal.  Abdominal: Soft. Bowel sounds are normal. She exhibits no distension and no mass. There is no tenderness. There is no rebound and no guarding.  Musculoskeletal: Normal range of motion. She exhibits no edema or tenderness.  Lymphadenopathy:    She has no cervical adenopathy.  Neurological: She is alert and oriented to person, place, and time. She has normal reflexes. No cranial nerve deficit. She exhibits normal muscle tone. Coordination normal.  Skin: Skin is warm and dry.  Psychiatric: She has a normal mood and affect. Her behavior is normal. Judgment and thought content normal.       Assessment & Plan:   Problem List Items Addressed This Visit      Other   Routine adult health maintenance - Primary    1) Anticipatory Guidance: Discussed  importance of wearing a seatbelt while driving and not texting while driving; changing batteries in smoke detector at least once annually; wearing suntan lotion when outside; eating a balanced and moderate diet; getting physical activity at least 30 minutes per day.  2) Immunizations / Screenings / Labs:  All immunizations are up-to-date per recommendations. Cervical cancer screening is up-to-date per recommendations. Obtain HIV antibody for HIV screening. All other screenings are up-to-date per recommendations. Obtain CBC, CMET, and lipid profile.    Overall well exam with risk factors for cardiovascular disease including obesity and tobacco use. Continues to smoke approximately one cigarette per week. Recommend weight loss of 5-10% of current body weight through nutrition and physical activity. Goal physical activity of 30 minutes of moderate level activity daily or proximally 10,000 steps per day. Continue nutritional intake that is moderate, balance, and varied. Encouraged to eat when hungry. Continue other healthy lifestyle behaviors and choices. Follow-up prevention exam  in 1 year. Follow-up office visit pending blood work and for chronic conditions.       Relevant Orders   CBC   Comprehensive metabolic panel   Lipid panel   HIV antibody   Class 2 obesity due to excess calories without serious comorbidity with body mass index (BMI) of 38.0 to 38.9 in adult    BMI of 38.10.  Recommend weight loss of 5-10% of current body weight. Recommend increasing physical activity to 30 minutes of moderate level activity daily. Encourage nutritional intake that focuses on nutrient dense foods and is moderate, varied, and balanced and is low in saturated fats and processed/sugary foods. Continue to monitor.            I have discontinued Ms. Whiteside's tiZANidine. I am also having her maintain her baclofen, zonisamide, albuterol, citalopram, and montelukast.   Meds ordered this encounter  Medications    . citalopram (CELEXA) 20 MG tablet    Sig: Take 1 tablet (20 mg total) by mouth daily.    Dispense:  90 tablet    Refill:  1  . montelukast (SINGULAIR) 10 MG tablet    Sig: Take 1 tablet (10 mg total) by mouth at bedtime.    Dispense:  90 tablet    Refill:  1     Follow-up: Return in about 1 year (around 11/04/2017), or if symptoms worsen or fail to improve.   Jeanine Luz, FNP

## 2016-11-04 NOTE — Patient Instructions (Addendum)
Thank you for choosing [facility].  SUMMARY AND INSTRUCTIONS:  Please continue to take your medications as prescribed.  Medication:  Your prescription(s) have been submitted to your pharmacy or been printed and provided for you. Please take as directed and contact our office if you believe you are having problem(s) with the medication(s) or have any questions.  Labs:  Please stop by the lab on the lower level of the building for your blood work. Your results will be released to [place] (or called to you) after review, usually within 72 hours after test completion. If any changes need to be made, you will be notified at that same time.  1.) The lab is open from 7:30am to 5:30 pm Monday-Friday 2.) No appointment is necessary 3.) Fasting (if needed) is 6-8 hours after food and drink; black coffee and water are okay   Follow up:  If your symptoms worsen or fail to improve, please contact our office for further instruction, or in case of emergency go directly to the emergency room at the closest medical facility.    Health Maintenance, Female Adopting a healthy lifestyle and getting preventive care can go a long way to promote health and wellness. Talk with your health care provider about what schedule of regular examinations is right for you. This is a good chance for you to check in with your provider about disease prevention and staying healthy. In between checkups, there are plenty of things you can do on your own. Experts have done a lot of research about which lifestyle changes and preventive measures are most likely to keep you healthy. Ask your health care provider for more information. Weight and diet Eat a healthy diet  Be sure to include plenty of vegetables, fruits, low-fat dairy products, and lean protein.  Do not eat a lot of foods high in solid fats, added sugars, or salt.  Get regular exercise. This is one of the most important things you can do for your  health. ? Most adults should exercise for at least 150 minutes each week. The exercise should increase your heart rate and make you sweat (moderate-intensity exercise). ? Most adults should also do strengthening exercises at least twice a week. This is in addition to the moderate-intensity exercise.  Maintain a healthy weight  Body mass index (BMI) is a measurement that can be used to identify possible weight problems. It estimates body fat based on height and weight. Your health care provider can help determine your BMI and help you achieve or maintain a healthy weight.  For females 78 years of age and older: ? A BMI below 18.5 is considered underweight. ? A BMI of 18.5 to 24.9 is normal. ? A BMI of 25 to 29.9 is considered overweight. ? A BMI of 30 and above is considered obese.  Watch levels of cholesterol and blood lipids  You should start having your blood tested for lipids and cholesterol at 26 years of age, then have this test every 5 years.  You may need to have your cholesterol levels checked more often if: ? Your lipid or cholesterol levels are high. ? You are older than 26 years of age. ? You are at high risk for heart disease.  Cancer screening Lung Cancer  Lung cancer screening is recommended for adults 59-36 years old who are at high risk for lung cancer because of a history of smoking.  A yearly low-dose CT scan of the lungs is recommended for people who: ? Currently  smoke. ? Have quit within the past 15 years. ? Have at least a 30-pack-year history of smoking. A pack year is smoking an average of one pack of cigarettes a day for 1 year.  Yearly screening should continue until it has been 15 years since you quit.  Yearly screening should stop if you develop a health problem that would prevent you from having lung cancer treatment.  Breast Cancer  Practice breast self-awareness. This means understanding how your breasts normally appear and feel.  It also means  doing regular breast self-exams. Let your health care provider know about any changes, no matter how small.  If you are in your 20s or 30s, you should have a clinical breast exam (CBE) by a health care provider every 1-3 years as part of a regular health exam.  If you are 32 or older, have a CBE every year. Also consider having a breast X-ray (mammogram) every year.  If you have a family history of breast cancer, talk to your health care provider about genetic screening.  If you are at high risk for breast cancer, talk to your health care provider about having an MRI and a mammogram every year.  Breast cancer gene (BRCA) assessment is recommended for women who have family members with BRCA-related cancers. BRCA-related cancers include: ? Breast. ? Ovarian. ? Tubal. ? Peritoneal cancers.  Results of the assessment will determine the need for genetic counseling and BRCA1 and BRCA2 testing.  Cervical Cancer Your health care provider may recommend that you be screened regularly for cancer of the pelvic organs (ovaries, uterus, and vagina). This screening involves a pelvic examination, including checking for microscopic changes to the surface of your cervix (Pap test). You may be encouraged to have this screening done every 3 years, beginning at age 23.  For women ages 57-65, health care providers may recommend pelvic exams and Pap testing every 3 years, or they may recommend the Pap and pelvic exam, combined with testing for human papilloma virus (HPV), every 5 years. Some types of HPV increase your risk of cervical cancer. Testing for HPV may also be done on women of any age with unclear Pap test results.  Other health care providers may not recommend any screening for nonpregnant women who are considered low risk for pelvic cancer and who do not have symptoms. Ask your health care provider if a screening pelvic exam is right for you.  If you have had past treatment for cervical cancer or a  condition that could lead to cancer, you need Pap tests and screening for cancer for at least 20 years after your treatment. If Pap tests have been discontinued, your risk factors (such as having a new sexual partner) need to be reassessed to determine if screening should resume. Some women have medical problems that increase the chance of getting cervical cancer. In these cases, your health care provider may recommend more frequent screening and Pap tests.  Colorectal Cancer  This type of cancer can be detected and often prevented.  Routine colorectal cancer screening usually begins at 26 years of age and continues through 26 years of age.  Your health care provider may recommend screening at an earlier age if you have risk factors for colon cancer.  Your health care provider may also recommend using home test kits to check for hidden blood in the stool.  A small camera at the end of a tube can be used to examine your colon directly (sigmoidoscopy or colonoscopy). This  is done to check for the earliest forms of colorectal cancer.  Routine screening usually begins at age 25.  Direct examination of the colon should be repeated every 5-10 years through 26 years of age. However, you may need to be screened more often if early forms of precancerous polyps or small growths are found.  Skin Cancer  Check your skin from head to toe regularly.  Tell your health care provider about any new moles or changes in moles, especially if there is a change in a mole's shape or color.  Also tell your health care provider if you have a mole that is larger than the size of a pencil eraser.  Always use sunscreen. Apply sunscreen liberally and repeatedly throughout the day.  Protect yourself by wearing long sleeves, pants, a wide-brimmed hat, and sunglasses whenever you are outside.  Heart disease, diabetes, and high blood pressure  High blood pressure causes heart disease and increases the risk of stroke.  High blood pressure is more likely to develop in: ? People who have blood pressure in the high end of the normal range (130-139/85-89 mm Hg). ? People who are overweight or obese. ? People who are African American.  If you are 76-69 years of age, have your blood pressure checked every 3-5 years. If you are 46 years of age or older, have your blood pressure checked every year. You should have your blood pressure measured twice-once when you are at a hospital or clinic, and once when you are not at a hospital or clinic. Record the average of the two measurements. To check your blood pressure when you are not at a hospital or clinic, you can use: ? An automated blood pressure machine at a pharmacy. ? A home blood pressure monitor.  If you are between 39 years and 77 years old, ask your health care provider if you should take aspirin to prevent strokes.  Have regular diabetes screenings. This involves taking a blood sample to check your fasting blood sugar level. ? If you are at a normal weight and have a low risk for diabetes, have this test once every three years after 26 years of age. ? If you are overweight and have a high risk for diabetes, consider being tested at a younger age or more often. Preventing infection Hepatitis B  If you have a higher risk for hepatitis B, you should be screened for this virus. You are considered at high risk for hepatitis B if: ? You were born in a country where hepatitis B is common. Ask your health care provider which countries are considered high risk. ? Your parents were born in a high-risk country, and you have not been immunized against hepatitis B (hepatitis B vaccine). ? You have HIV or AIDS. ? You use needles to inject street drugs. ? You live with someone who has hepatitis B. ? You have had sex with someone who has hepatitis B. ? You get hemodialysis treatment. ? You take certain medicines for conditions, including cancer, organ transplantation, and  autoimmune conditions.  Hepatitis C  Blood testing is recommended for: ? Everyone born from [birth year] through 1965. ? Anyone with known risk factors for hepatitis C.  Sexually transmitted infections (STIs)  You should be screened for sexually transmitted infections (STIs) including gonorrhea and chlamydia if: ? You are sexually active and are younger than 26 years of age. ? You are older than 27 years of age and your health care provider tells you that you  are at risk for this type of infection. ? Your sexual activity has changed since you were last screened and you are at an increased risk for chlamydia or gonorrhea. Ask your health care provider if you are at risk.  If you do not have HIV, but are at risk, it may be recommended that you take a prescription medicine daily to prevent HIV infection. This is called pre-exposure prophylaxis (PrEP). You are considered at risk if: ? You are sexually active and do not regularly use condoms or know the HIV status of your partner(s). ? You take drugs by injection. ? You are sexually active with a partner who has HIV.  Talk with your health care provider about whether you are at high risk of being infected with HIV. If you choose to begin PrEP, you should first be tested for HIV. You should then be tested every 3 months for as long as you are taking PrEP. Pregnancy  If you are premenopausal and you may become pregnant, ask your health care provider about preconception counseling.  If you may become pregnant, take 400 to 800 micrograms (mcg) of folic acid every day.  If you want to prevent pregnancy, talk to your health care provider about birth control (contraception). Osteoporosis and menopause  Osteoporosis is a disease in which the bones lose minerals and strength with aging. This can result in serious bone fractures. Your risk for osteoporosis can be identified using a bone density scan.  If you are 52 years of age or older, or if you are at  risk for osteoporosis and fractures, ask your health care provider if you should be screened.  Ask your health care provider whether you should take a calcium or vitamin D supplement to lower your risk for osteoporosis.  Menopause may have certain physical symptoms and risks.  Hormone replacement therapy may reduce some of these symptoms and risks. Talk to your health care provider about whether hormone replacement therapy is right for you. Follow these instructions at home:  Schedule regular health, dental, and eye exams.  Stay current with your immunizations.  Do not use any tobacco products including cigarettes, chewing tobacco, or electronic cigarettes.  If you are pregnant, do not drink alcohol.  If you are breastfeeding, limit how much and how often you drink alcohol.  Limit alcohol intake to no more than 1 drink per day for nonpregnant women. One drink equals 12 ounces of beer, 5 ounces of wine, or 1 ounces of hard liquor.  Do not use street drugs.  Do not share needles.  Ask your health care provider for help if you need support or information about quitting drugs.  Tell your health care provider if you often feel depressed.  Tell your health care provider if you have ever been abused or do not feel safe at home. This information is not intended to replace advice given to you by your health care provider. Make sure you discuss any questions you have with your health care provider. Document Released: 10/07/2010 Document Revised: 08/30/2015 Document Reviewed: 12/26/2014 Elsevier Interactive Patient Education  [employer].

## 2016-11-04 NOTE — Assessment & Plan Note (Signed)
BMI of 38.10.  Recommend weight loss of 5-10% of current body weight. Recommend increasing physical activity to 30 minutes of moderate level activity daily. Encourage nutritional intake that focuses on nutrient dense foods and is moderate, varied, and balanced and is low in saturated fats and processed/sugary foods. Continue to monitor.

## 2016-11-04 NOTE — Assessment & Plan Note (Addendum)
1) Anticipatory Guidance: Discussed importance of wearing a seatbelt while driving and not texting while driving; changing batteries in smoke detector at least once annually; wearing suntan lotion when outside; eating a balanced and moderate diet; getting physical activity at least 30 minutes per day.  2) Immunizations / Screenings / Labs:  All immunizations are up-to-date per recommendations. Cervical cancer screening is up-to-date per recommendations. Obtain HIV antibody for HIV screening. All other screenings are up-to-date per recommendations. Obtain CBC, CMET, and lipid profile.    Overall well exam with risk factors for cardiovascular disease including obesity and tobacco use. Continues to smoke approximately one cigarette per week. Recommend weight loss of 5-10% of current body weight through nutrition and physical activity. Goal physical activity of 30 minutes of moderate level activity daily or proximally 10,000 steps per day. Continue nutritional intake that is moderate, balance, and varied. Encouraged to eat when hungry. Continue other healthy lifestyle behaviors and choices. Follow-up prevention exam in 1 year. Follow-up office visit pending blood work and for chronic conditions.

## 2016-11-12 MED FILL — MONTELUKAST SOD 10 MG TAB: 10 | 90 days supply | Qty: 90 | Fill #0

## 2016-11-12 MED FILL — CITALOPRAM HBR 20 MG TABLET: 20 | 90 days supply | Qty: 90 | Fill #0

## 2016-11-19 ENCOUNTER — Ambulatory Visit: Payer: Self-pay | Admitting: Clinical

## 2016-12-29 ENCOUNTER — Ambulatory Visit (INDEPENDENT_AMBULATORY_CARE_PROVIDER_SITE_OTHER): Payer: 59 | Admitting: Psychology

## 2016-12-29 DIAGNOSIS — F331 Major depressive disorder, recurrent, moderate: Secondary | ICD-10-CM | POA: Diagnosis not present

## 2017-01-12 ENCOUNTER — Ambulatory Visit (INDEPENDENT_AMBULATORY_CARE_PROVIDER_SITE_OTHER): Payer: 59 | Admitting: Psychology

## 2017-01-12 DIAGNOSIS — F331 Major depressive disorder, recurrent, moderate: Secondary | ICD-10-CM | POA: Diagnosis not present

## 2017-01-13 ENCOUNTER — Ambulatory Visit (INDEPENDENT_AMBULATORY_CARE_PROVIDER_SITE_OTHER): Payer: 59 | Admitting: Neurology

## 2017-01-13 ENCOUNTER — Encounter: Payer: Self-pay | Admitting: Neurology

## 2017-01-13 VITALS — BP 118/76

## 2017-01-13 DIAGNOSIS — G43719 Chronic migraine without aura, intractable, without status migrainosus: Secondary | ICD-10-CM

## 2017-01-13 DIAGNOSIS — G43119 Migraine with aura, intractable, without status migrainosus: Secondary | ICD-10-CM

## 2017-01-13 MED ORDER — ZOLMITRIPTAN 5 MG NA SOLN: 5.0000 mg | NASAL | 0 refills | Status: DC | PRN

## 2017-01-13 NOTE — Progress Notes (Signed)
NEUROLOGY CONSULTATION NOTE  Kayla Owens MRN: [id number] DOB: 1991-02-01  Referring provider: Jeanine Luz, FNP Primary care provider: Jeanine Luz, FNP  Reason for consult:  migraines  HISTORY OF PRESENT ILLNESS: Kayla Owens is a 26 year old female with asthma and depression who presents for migraines.  History supplemented by prior neurologist's note.  Onset:  26 years old Location:  Frontal/bi-temporal,sometimes occipital as well Quality:  Pounding/pressure Intensity:  10/10 Aura:  Sometimes preceded by blindspots in her vision Prodrome:  no Postdrome:  no Associated symptoms:  Nausea, vomiting, photophobia, phonophobia, osmophobia.  She has not had any new worse headache of her life, waking up from sleep Duration:  At least 2 days Frequency:  16 headache days per month for several years Triggers/exacerbating factors:  unknown Relieving factors:  Rubbing temples, laying in dark quiet cool room Activity:  Cannot function 4 days per month.  Past NSAIDS:  Aleve, diclofenac, Mobic, toradol Past analgesics:  acetaminophen, Goody, BC, Excedrin Past abortive triptans:  Sumatriptan tablet, Maxalt Past muscle relaxants:  tizanidine, cyclobenzaprine Past anti-emetic:  promethazine Past antihypertensive medications:  no Past antidepressant medications:  Amitriptyline, nortriptyline Past anticonvulsant medications:  topiramate Past vitamins/Herbal/Supplements:  no Other past therapies:  no  Current NSAIDS:  Naproxen Current analgesics:  no Current triptans:  no Current anti-emetic:  no Current muscle relaxants:  baclofen  Current anti-anxiolytic:  no Current sleep aide:  no Current Antihypertensive medications:  no Current Antidepressant medications:  citalopram  Current Anticonvulsant medications:  no Current Vitamins/Herbal/Supplements:  MVI Current Antihistamines/Decongestants:  no Other therapy:  no  Caffeine:  no Alcohol:  no Smoker:  yes Diet:   Hydrates, no soda Exercise:  yes Depression/anxiety:  controlled Sleep hygiene:  varies Family history of headache/migraine:  mother, maternal grandmother  Reportedly had MRI of brain in 2009 which was negative. 03/27/16 Normal ECG with NSR and QT/QTc 350/409.  PAST MEDICAL HISTORY: Past Medical History:  Diagnosis Date  . Asthma   . Chicken pox   . Hx of migraines     PAST SURGICAL HISTORY: Past Surgical History:  Procedure Laterality Date  . biopsy of cervix    . WISDOM TOOTH EXTRACTION      MEDICATIONS: Current Outpatient Prescriptions on File Prior to Visit  Medication Sig Dispense Refill  . albuterol (PROVENTIL HFA;VENTOLIN HFA) 108 (90 Base) MCG/ACT inhaler Inhale into the lungs every 6 (six) hours as needed for wheezing or shortness of breath.    . baclofen (LIORESAL) 10 MG tablet Take 10 mg by mouth 3 (three) times daily.    . citalopram (CELEXA) 20 MG tablet Take 1 tablet (20 mg total) by mouth daily. 90 tablet 1  . montelukast (SINGULAIR) 10 MG tablet Take 1 tablet (10 mg total) by mouth at bedtime. 90 tablet 1  . zonisamide (ZONEGRAN) 25 MG capsule Take 25 mg by mouth 4 (four) times daily.     No current facility-administered medications on file prior to visit.     ALLERGIES: No Known Allergies  FAMILY HISTORY: Family History  Problem Relation Age of Onset  . Arthritis Mother   . Hypertension Mother   . Lupus Mother   . Heart disease Maternal Grandmother   . Heart disease Maternal Grandfather   . Arthritis Paternal Grandmother   . Stomach cancer Paternal Grandfather     SOCIAL HISTORY: Social History   Social History  . Marital status: Single    Spouse name: N/A  . Number of children: 0  . Years of  education: 14   Occupational History  . R[employer] [place] [employer]   Social History Main Topics  . Smoking status: Current Some Day Smoker    Types: Cigarettes  . Smokeless tobacco: Never Used     Comment: smokes black and mild cigars QOD  . Alcohol use  No  . Drug use: No  . Sexual activity: Yes   Other Topics Concern  . Not on file   Social History Narrative   Fun/Hobby: Swimming / pool   Single, lives with boyfriend, avoids caffeine. Exercises 2x a week    REVIEW OF SYSTEMS: Constitutional: No fevers, chills, or sweats, no generalized fatigue, change in appetite Eyes: No visual changes, double vision, eye pain Ear, nose and throat: No hearing loss, ear pain, nasal congestion, sore throat Cardiovascular: No chest pain, palpitations Respiratory:  No shortness of breath at rest or with exertion, wheezes GastrointestinaI: No nausea, vomiting, diarrhea, abdominal pain, fecal incontinence Genitourinary:  No dysuria, urinary retention or frequency Musculoskeletal:  No neck pain, back pain Integumentary: No rash, pruritus, skin lesions Neurological: as above Psychiatric: No depression, insomnia, anxiety Endocrine: No palpitations, fatigue, diaphoresis, mood swings, change in appetite, change in weight, increased thirst Hematologic/Lymphatic:  No purpura, petechiae. Allergic/Immunologic: no itchy/runny eyes, nasal congestion, recent allergic reactions, rashes  PHYSICAL EXAM: Vitals:   01/13/17 1516  BP: 118/76  HR: 70 General: No acute distress.  Patient appears well-groomed.  Head:  Normocephalic/atraumatic Eyes:  fundi examined but not visualized Neck: supple, no paraspinal tenderness, full range of motion Back: No paraspinal tenderness Heart: regular rate and rhythm Lungs: Clear to auscultation bilaterally. Vascular: No carotid bruits. Neurological Exam: Mental status: alert and oriented to person, place, and time, recent and remote memory intact, fund of knowledge intact, attention and concentration intact, speech fluent and not dysarthric, language intact. Cranial nerves: CN I: not tested CN II: pupils equal, round and reactive to light, visual fields intact CN III, IV, VI:  full range of motion, no nystagmus, no  ptosis CN V: facial sensation intact CN VII: upper and lower face symmetric CN VIII: hearing intact CN IX, X: gag intact, uvula midline CN XI: sternocleidomastoid and trapezius muscles intact CN XII: tongue midline Bulk & Tone: normal, no fasciculations. Motor:  5/5 throughout  Sensation: temperature and vibration sensation intact. Deep Tendon Reflexes:  2+ throughout, toes downgoing.  Finger to nose testing:  Without dysmetria.  Heel to shin:  Without dysmetria.   Gait:  Normal station and stride.  Able to turn and tandem walk. Romberg negative.  IMPRESSION: Chronic migraine/migraine with aura.  She has over 3 consecutive months of over 15 headache days per month and has failed multiple preventative medications such as amitriptyline, nortriptyline and topiramate.   Cigarette smoker  PLAN: 1.  Preauthorization for Botox 2.  Zomig  NS for abortive therapy 3.  Lifestyle modification:  Sleep hygiene, smoking cessation 4.  Consider magnesium, riboflavin, CoQ10 5.  Follow up for first round of Botox  Thank you for allowing me to take part in the care of this patient.  Shon Millet, DO  CC:  Jeanine Luz, FNP

## 2017-01-13 NOTE — Patient Instructions (Signed)
Migraine Recommendations: 1.  We will start you with Botox.  We will get preauthorization and schedule you 2.  Take Zomig  1 spray in nostril at earliest onset of headache.  May repeat dose once in 2 hours if needed.  Do not exceed two sprays in 24 hours.  If it works, contact us and I will send a prescription 3.  Limit use of pain relievers to no more than 2 days out of the week.  These medications include acetaminophen, ibuprofen, triptans and narcotics.  This will help reduce risk of rebound headaches. 4.  Be aware of common food triggers such as processed sweets, processed foods with nitrites (such as deli meat, hot dogs, sausages), foods with MSG, alcohol (such as wine), chocolate, certain cheeses, certain fruits (dried fruits, some citrus fruit), vinegar, diet soda. 4.  Avoid caffeine 5.  Routine exercise 6.  Proper sleep hygiene 7.  Stay adequately hydrated with water 8.  Keep a headache diary. 9.  Maintain proper stress management. 10.  Do not skip meals. 11.  Consider supplements:  Magnesium citrate  to  daily, riboflavin , Coenzyme Q 10  three times daily

## 2017-01-19 ENCOUNTER — Ambulatory Visit (INDEPENDENT_AMBULATORY_CARE_PROVIDER_SITE_OTHER): Payer: 59 | Admitting: Psychology

## 2017-01-19 DIAGNOSIS — F331 Major depressive disorder, recurrent, moderate: Secondary | ICD-10-CM | POA: Diagnosis not present

## 2017-02-03 ENCOUNTER — Ambulatory Visit (INDEPENDENT_AMBULATORY_CARE_PROVIDER_SITE_OTHER): Payer: 59 | Admitting: Psychology

## 2017-02-03 DIAGNOSIS — F331 Major depressive disorder, recurrent, moderate: Secondary | ICD-10-CM

## 2017-02-06 ENCOUNTER — Other Ambulatory Visit (INDEPENDENT_AMBULATORY_CARE_PROVIDER_SITE_OTHER): Payer: 59

## 2017-02-06 ENCOUNTER — Other Ambulatory Visit: Payer: Worker's Compensation

## 2017-02-06 ENCOUNTER — Encounter: Payer: Self-pay | Admitting: Nurse Practitioner

## 2017-02-06 ENCOUNTER — Other Ambulatory Visit (HOSPITAL_COMMUNITY)
Admission: RE | Admit: 2017-02-06 | Discharge: 2017-02-06 | Disposition: A | Discharge status: Home / Self Care | Payer: 59 | Source: Ambulatory Visit | Attending: Nurse Practitioner | Admitting: Nurse Practitioner

## 2017-02-06 ENCOUNTER — Ambulatory Visit (INDEPENDENT_AMBULATORY_CARE_PROVIDER_SITE_OTHER): Payer: 59 | Admitting: Nurse Practitioner

## 2017-02-06 ENCOUNTER — Telehealth: Payer: Self-pay | Admitting: Nurse Practitioner

## 2017-02-06 VITALS — BP 120/86 | HR 79 | Temp 98.5°F | Resp 16 | Ht 69.0 in | Wt 256.0 lb

## 2017-02-06 DIAGNOSIS — F419 Anxiety disorder, unspecified: Secondary | ICD-10-CM | POA: Diagnosis not present

## 2017-02-06 DIAGNOSIS — J4540 Moderate persistent asthma, uncomplicated: Secondary | ICD-10-CM | POA: Diagnosis not present

## 2017-02-06 DIAGNOSIS — N898 Other specified noninflammatory disorders of vagina: Secondary | ICD-10-CM | POA: Insufficient documentation

## 2017-02-06 DIAGNOSIS — Z0000 Encounter for general adult medical examination without abnormal findings: Secondary | ICD-10-CM | POA: Diagnosis not present

## 2017-02-06 DIAGNOSIS — F32A Depression, unspecified: Secondary | ICD-10-CM

## 2017-02-06 DIAGNOSIS — F329 Major depressive disorder, single episode, unspecified: Secondary | ICD-10-CM

## 2017-02-06 LAB — CBC
HCT: 38 % (ref 36.0–46.0)
Hemoglobin: 12.4 g/dL (ref 12.0–15.0)
MCHC: 32.5 g/dL (ref 30.0–36.0)
MCV: 91.1 fl (ref 78.0–100.0)
Platelets: 302 10*3/uL (ref 150.0–400.0)
RBC: 4.18 Mil/uL (ref 3.87–5.11)
RDW: 14.1 % (ref 11.5–15.5)
WBC: 7.2 10*3/uL (ref 4.0–10.5)

## 2017-02-06 LAB — LIPID PANEL
Cholesterol: 148 mg/dL (ref 0–200)
HDL: 55 mg/dL (ref >39.00)
LDL Cholesterol: 82 mg/dL (ref 0–99)
NonHDL: 92.57
Total CHOL/HDL Ratio: 3
Triglycerides: 54 mg/dL (ref 0.0–149.0)
VLDL: 10.8 mg/dL (ref 0.0–40.0)

## 2017-02-06 LAB — COMPREHENSIVE METABOLIC PANEL
ALT: 38 U/L — ABNORMAL HIGH (ref 0–35)
AST: 91 U/L — ABNORMAL HIGH (ref 0–37)
Albumin: 4.4 g/dL (ref 3.5–5.2)
Alkaline Phosphatase: 45 U/L (ref 39–117)
BUN: 8 mg/dL (ref 6–23)
CO2: 25 mEq/L (ref 19–32)
Calcium: 9.6 mg/dL (ref 8.4–10.5)
Chloride: 104 mEq/L (ref 96–112)
Creatinine, Ser: 0.88 mg/dL (ref 0.40–1.20)
GFR: 99.37 mL/min (ref >60.00)
Glucose, Bld: 94 mg/dL (ref 70–99)
Potassium: 3.6 mEq/L (ref 3.5–5.1)
Sodium: 136 mEq/L (ref 135–145)
Total Bilirubin: 0.4 mg/dL (ref 0.2–1.2)
Total Protein: 8.1 g/dL (ref 6.0–8.3)

## 2017-02-06 LAB — WET PREP, GENITAL
Trich, Wet Prep: NONE SEEN — AB
Yeast Wet Prep HPF POC: NONE SEEN — AB

## 2017-02-06 MED ORDER — METRONIDAZOLE 0.75 % VA GEL: 1.0000 | Freq: Two times a day (BID) | VAGINAL | 0 refills | Status: DC

## 2017-02-06 MED ORDER — BECLOMETHASONE DIPROP HFA 40 MCG/ACT IN AERB: 2.0000 | INHALATION_SPRAY | Freq: Every day | RESPIRATORY_TRACT | 3 refills | Status: AC

## 2017-02-06 MED ORDER — CITALOPRAM HYDROBROMIDE 20 MG PO TABS: 30.0000 mg | ORAL_TABLET | Freq: Every day | ORAL | 3 refills | Status: DC

## 2017-02-06 MED ORDER — MONTELUKAST SODIUM 10 MG PO TABS: 10.0000 mg | ORAL_TABLET | Freq: Every day | ORAL | 1 refills | Status: AC

## 2017-02-06 MED ORDER — ALBUTEROL SULFATE HFA 108 (90 BASE) MCG/ACT IN AERS: 1.0000 | INHALATION_SPRAY | Freq: Four times a day (QID) | RESPIRATORY_TRACT | 3 refills | Status: AC | PRN

## 2017-02-06 MED FILL — VENTOLIN HFA 90 MCG INHALER: 108 (90 BAS | 25 days supply | Qty: 18 | Fill #0

## 2017-02-06 MED FILL — MONTELUKAST SOD 10 MG TAB: 10 | 90 days supply | Qty: 90 | Fill #0

## 2017-02-06 NOTE — Patient Instructions (Addendum)
I have increased your celexa dose to 30mg  (1.5 tabs) daily. Try to take it in the morning to prevent not sleeping well at night.  I have sent a new prescription for QVAR- 2 puffs once daily. This is an inhaled steroid that you will use every day to prevent asthma flares. Please continue taking your singulair daily and keep your albuterol inhaler as needed.  Id like to see you back in one month. We can recheck your asthma and anxiety and possibly do your physical if you are feeling well.  I will let you know about the tests today.  It was nice to meet you. Thanks for letting me take care of you today :)

## 2017-02-06 NOTE — Addendum Note (Signed)
Addended by: Mercer PodWRENN, Jaton Eilers E on: 02/06/2017 04:13 PM   Modules accepted: Orders

## 2017-02-06 NOTE — Assessment & Plan Note (Signed)
Maintained on celexa 20mg  daily. Following with psychology. She feels some improvement but continues to have symptoms. Will increase celexa dose to 30mg  daily. F/u in 1 month

## 2017-02-06 NOTE — Progress Notes (Signed)
Subjective:    Patient ID: Kayla Owens, female    DOB: [birth year], 26 y.o.   MRN: [id number]  HPI Kayla Owens is a 26 yo who presents today to establish care. She is transferring to me from another provider in the same clinic.  Anxiety and depression- Maintained on celexa. She began this mediation in July. shes felt some improvement in her symptoms but continues to feel some anxiety and depression daily. She feels easily frustrated and not interested in her normal acitivites. She denies thoughts of harming herself or others. Shes been seeing a psychologist weekly which she looks forward to and feels that it greatly improves her mood. Shes also noticed trouble sleeping at nigth since she began the medication. She has been taking the medication in the evening.   Depression screen [facility] 2/9 02/06/2017 10/03/2016  Decreased Interest 0 2  Down, Depressed, Hopeless 2 3  PHQ - 2 Score 2 5  Altered sleeping 3 3  Tired, decreased energy 2 2  Change in appetite 3 3  Feeling bad or failure about yourself  2 2  Trouble concentrating 2 1  Moving slowly or fidgety/restless 2 1  Suicidal thoughts 2 2  PHQ-9 Score 18 19   GAD 7 : Generalized Anxiety Score 02/06/2017 10/03/2016  Nervous, Anxious, on Edge 3 3  Control/stop worrying 2 3  Worry too much - different things 3 3  Trouble relaxing 3 3  Restless 3 3  Easily annoyed or irritable 3 3  Afraid - awful might happen 3 3  Total GAD 7 Score 20 21    Moderate persistent asthma- Maintained on singiulir daily, albuterol as needed. she has been using her albuterol about three times a week. once at night and twice after exercise due to wheezing.  Vaginal discharge-  Onset about 3 weeks ago. Shes having to wear a panitliner daily due to the amount of discharge. The discharge is an eggshell color and smells foul. She denies any fevers, nausea, vomiting, abdominal pain, pelvic pain, vaginal itching, vaginal burning, vaginal bleeding, dysuria, hematuria.  shes been sexually active recently without protection.   Review of Systems  See HPI  Past Medical History:  Diagnosis Date  . Anxiety   . Asthma   . Chicken pox   . Depression   . Hx of migraines      Social History   Social History  . Marital status: Single    Spouse name: N/A  . Number of children: 0  . Years of education: 14   Occupational History  . RMA    Social History Main Topics  . Smoking status: Current Some Day Smoker    Types: Cigarettes  . Smokeless tobacco: Never Used     Comment: smokes black and mild cigars QOD  . Alcohol use No  . Drug use: No  . Sexual activity: Yes   Other Topics Concern  . Not on file   Social History Narrative   Fun/Hobby: Swimming / pool   Single, lives with boyfriend, avoids caffeine. Exercises 2x a week    Past Surgical History:  Procedure Laterality Date  . biopsy of cervix    . WISDOM TOOTH EXTRACTION      Family History  Problem Relation Age of Onset  . Arthritis Mother   . Hypertension Mother   . Lupus Mother   . Heart disease Maternal Grandmother   . Heart disease Maternal Grandfather   . Arthritis Paternal Grandmother   . Stomach  cancer Paternal Grandfather     No Known Allergies  Current Outpatient Prescriptions on File Prior to Visit  Medication Sig Dispense Refill  . baclofen (LIORESAL) 10 MG tablet Take 10 mg by mouth 3 (three) times daily.    Marland Kitchen. zolmitriptan (ZOMIG) 5 MG nasal solution Place 1 spray into the nose as needed for migraine. 6 Units 0  . zonisamide (ZONEGRAN) 25 MG capsule Take 25 mg by mouth 4 (four) times daily.     No current facility-administered medications on file prior to visit.     BP 120/86 (BP Location: Left Arm, Patient Position: Sitting, Cuff Size: Large)   Pulse 79   Temp 98.5 F (36.9 C) (Oral)   Resp 16   Ht 5\' 9"  (1.753 m)   Wt 256 lb (116.1 kg)   SpO2 98%   BMI 37.80 kg/m       Objective:   Physical Exam  Constitutional: She is oriented to  person, place, and time. She appears well-developed and well-nourished. No distress.  Cardiovascular: Normal rate, regular rhythm, normal heart sounds and intact distal pulses.   Pulmonary/Chest: Effort normal and breath sounds normal. No respiratory distress. She has no wheezes.  Genitourinary: Vagina normal. There is no rash, tenderness or lesion on the right labia. There is no rash, tenderness or lesion on the left labia. No tenderness in the vagina.  Genitourinary Comments: No copious or foul discharge noted.  Neurological: She is alert and oriented to person, place, and time.  Skin: Skin is warm and dry. No rash noted.  Psychiatric: She has a normal mood and affect. Judgment and thought content normal.      Assessment & Plan:  Vaginal discharge- suspicious for vaginal infection-copoius, foul smell, white. Urine cytology for trich, GC and wet prep for bacteria and yeast collected.  She'll return in one month for CPE, routine labs drawn today per past provider orders.

## 2017-02-06 NOTE — Assessment & Plan Note (Addendum)
Symptoms consistent with moderate persistent asthma. Maintained on singulair once daily and albuterol prn. shes using albuterol inhaler about 3 times a week for wheezing. Will add low dose ICS-qvar hfa per asthma guidelines. Instructions to wash mouth after ICS use. Shell return in 1 month for follow up

## 2017-02-07 LAB — HIV ANTIBODY (ROUTINE TESTING W REFLEX): HIV 1&2 Ab, 4th Generation: NONREACTIVE

## 2017-02-09 ENCOUNTER — Other Ambulatory Visit: Payer: 59

## 2017-02-09 ENCOUNTER — Ambulatory Visit (INDEPENDENT_AMBULATORY_CARE_PROVIDER_SITE_OTHER): Payer: 59 | Admitting: Psychology

## 2017-02-09 DIAGNOSIS — F331 Major depressive disorder, recurrent, moderate: Secondary | ICD-10-CM | POA: Diagnosis not present

## 2017-02-09 LAB — URINE CYTOLOGY ANCILLARY ONLY
Chlamydia: NEGATIVE
Neisseria Gonorrhea: NEGATIVE
Trichomonas: NEGATIVE

## 2017-02-09 MED FILL — CITALOPRAM HBR 20 MG TABLET: 20 | 30 days supply | Qty: 45 | Fill #0

## 2017-02-10 MED ORDER — METRONIDAZOLE 500 MG PO TABS: 500.0000 mg | ORAL_TABLET | Freq: Two times a day (BID) | ORAL | 0 refills | Status: DC

## 2017-02-10 MED FILL — metroNIDAZOLE 500 MG TABS: 500 | 7 days supply | Qty: 14 | Fill #0

## 2017-02-10 NOTE — Telephone Encounter (Signed)
Pt aware of rx being sent in. 

## 2017-02-10 NOTE — Addendum Note (Signed)
Addended by: Evaristo BurySHAMBLEY, ASHLEIGH N on: 02/10/2017 09:27 AM   Modules accepted: Orders

## 2017-02-10 NOTE — Telephone Encounter (Signed)
Pt called and her metroNIDAZOLE (METROGEL) 0.75 % vaginal gel  Was sent to [facility], she would like this resent [facility]  Also she would like to know if she can get this in pill form because she is on her mensus right now, please advise

## 2017-02-10 NOTE — Telephone Encounter (Signed)
Please advise on pill form.

## 2017-02-10 NOTE — Telephone Encounter (Signed)
I have sent a prescription for flagyl 500mg  by mouth twice daily for 7 days. She should complete the entire course of antibiotics.

## 2017-02-11 NOTE — Progress Notes (Signed)
Called [facility] to inquire if upcoming Botox appt (02/20/17) needs PA. Spoke with [place], neither [phone] nor [id number] require precertification. Pt has met her deductible, will be covered at 80% Call ref# [id number]

## 2017-02-17 ENCOUNTER — Ambulatory Visit (INDEPENDENT_AMBULATORY_CARE_PROVIDER_SITE_OTHER): Payer: 59 | Admitting: Psychology

## 2017-02-17 DIAGNOSIS — F331 Major depressive disorder, recurrent, moderate: Secondary | ICD-10-CM | POA: Diagnosis not present

## 2017-02-20 ENCOUNTER — Ambulatory Visit (INDEPENDENT_AMBULATORY_CARE_PROVIDER_SITE_OTHER): Payer: 59 | Admitting: Neurology

## 2017-02-20 DIAGNOSIS — G43719 Chronic migraine without aura, intractable, without status migrainosus: Secondary | ICD-10-CM | POA: Diagnosis not present

## 2017-02-20 MED ORDER — ONABOTULINUMTOXINA 100 UNITS IJ SOLR
155.0000 [IU] | Freq: Once | INTRAMUSCULAR | Status: AC
  Administered 2017-02-20: 155 [IU] via INTRAMUSCULAR

## 2017-02-20 NOTE — Progress Notes (Signed)
[facility]   Procedure Note Botox  Attending: Dr. Adam Jaffe  Preoperative Diagnosis(es): Chronic migraine  Consent obtained from: The patient Benefits discussed included, but were not limited to decreased muscle tightness, increased joint range of motion, and decreased pain.  Risk discussed included, but were not limited pain and discomfort, bleeding, bruising, excessive weakness, venous thrombosis, muscle atrophy and dysphagia.  Anticipated outcomes of the procedure as well as he risks and benefits of the alternatives to the procedure, and the roles and tasks of the personnel to be involved, were discussed with the patient, and the patient consents to the procedure and agrees to proceed. A copy of the patient medication guide was given to the patient which explains the blackbox warning.  Patients identity and treatment sites confirmed Yes.  .  Details of Procedure: Skin was cleaned with alcohol. Prior to injection, the needle plunger was aspirated to make sure the needle was not within a blood vessel.  There was no blood retrieved on aspiration.    Following is a summary of the muscles injected  And the amount of Botulinum toxin used:  Dilution 200 units of Botox was reconstituted with 4 ml of preservative free normal saline. Time of reconstitution: At the time of the office visit (<30 minutes prior to injection)   Injections  155 total units of Botox was injected with a 30 gauge needle.  Injection Sites: L occipitalis: 15 units- 3 sites  R occiptalis: 15 units- 3 sites  L upper trapezius: 15 units- 3 sites R upper trapezius: 15 units- 3 sits          L paraspinal: 10 units- 2 sites R paraspinal: 10 units- 2 sites  Face L frontalis(2 injection sites):10 units   R frontalis(2 injection sites):10 units         L corrugator: 5 units   R corrugator: 5 units           Procerus: 5 units   L temporalis: 20 units R temporalis: 20 units   Agent:  200 units of botulinum Type  A (Onobotulinum Toxin type A) was reconstituted with 4 ml of preservative free normal saline.  Time of reconstitution: At the time of the office visit (<30 minutes prior to injection)     Total injected (Units): 155  Total wasted (Units): 45  Patient tolerated procedure well without complications.   Reinjection is anticipated in 3 months. Return to clinic in 4.5 months.  

## 2017-03-03 ENCOUNTER — Ambulatory Visit (INDEPENDENT_AMBULATORY_CARE_PROVIDER_SITE_OTHER): Payer: 59 | Admitting: Psychology

## 2017-03-03 DIAGNOSIS — F331 Major depressive disorder, recurrent, moderate: Secondary | ICD-10-CM | POA: Diagnosis not present

## 2017-03-25 ENCOUNTER — Ambulatory Visit: Payer: Self-pay | Admitting: Nurse Practitioner

## 2017-05-14 NOTE — Progress Notes (Signed)
Initiated Botox auth on cover my meds, Key: [id number]

## 2017-05-19 NOTE — Progress Notes (Signed)
Rcvd fax from [employer] indicating Botox not approved, stating Pt not eligible for pharmacy benefits. Called Pt, LM on VM asking her to call me back to confirm insurance information.

## 2017-05-22 NOTE — Progress Notes (Addendum)
Called Pt again, LM on VM. Advising I will need to talk with her to determine insurance coverage for Botox or will need to cx appt.  Pt called back, she now has [employer], she is faxing front and back of card to me.  Called [employer], spoke Eddie DibblesColin W. He is faxing form to complete and fax back to [phone]

## 2017-05-25 NOTE — Progress Notes (Signed)
Called [employer] spoke with Kayla Owens, he faxed form, filled out and faxed back

## 2017-05-26 ENCOUNTER — Telehealth: Payer: Self-pay | Admitting: Nurse Practitioner

## 2017-05-26 NOTE — Telephone Encounter (Signed)
Copied from CRM [phone]. Topic: Quick Communication - Lab Results >> May 26, 2017  4:04 PM Guinevere FerrariMorris, Nichlos Kunzler E, NT wrote: Patient called and said she had never received her results regarding her lab's and wanted a call back. Pt said she can view them in my chart but don't understand the readings.

## 2017-05-26 NOTE — Progress Notes (Addendum)
[place] from [employer], Botox [phone]([id number] & [phone]) do not require PA. Fax is from Pharmacy services. I called to ensure medical side also does not require PA. Spoke with Loraine LericheMark, neither code requires PA, OK for buy and bill. Reference # for call [id number]. Called Pt, LM on VM advising her Botox does not require PA.

## 2017-05-27 NOTE — Telephone Encounter (Signed)
LVM for pt to call back in regards.  

## 2017-05-29 ENCOUNTER — Encounter: Payer: Self-pay | Admitting: Neurology

## 2017-05-29 ENCOUNTER — Ambulatory Visit (INDEPENDENT_AMBULATORY_CARE_PROVIDER_SITE_OTHER): Payer: 59 | Admitting: Neurology

## 2017-05-29 DIAGNOSIS — G43719 Chronic migraine without aura, intractable, without status migrainosus: Secondary | ICD-10-CM | POA: Diagnosis not present

## 2017-05-29 MED ORDER — ONABOTULINUMTOXINA 100 UNITS IJ SOLR
155.0000 [IU] | Freq: Once | INTRAMUSCULAR | Status: AC
  Administered 2017-05-29: 155 [IU] via INTRAMUSCULAR

## 2017-05-29 NOTE — Progress Notes (Signed)
[facility]   Procedure Note Botox  Attending: Dr. Adam Jaffe  Preoperative Diagnosis(es): Chronic migraine  Consent obtained from: The patient Benefits discussed included, but were not limited to decreased muscle tightness, increased joint range of motion, and decreased pain.  Risk discussed included, but were not limited pain and discomfort, bleeding, bruising, excessive weakness, venous thrombosis, muscle atrophy and dysphagia.  Anticipated outcomes of the procedure as well as he risks and benefits of the alternatives to the procedure, and the roles and tasks of the personnel to be involved, were discussed with the patient, and the patient consents to the procedure and agrees to proceed. A copy of the patient medication guide was given to the patient which explains the blackbox warning.  Patients identity and treatment sites confirmed Yes.  .  Details of Procedure: Skin was cleaned with alcohol. Prior to injection, the needle plunger was aspirated to make sure the needle was not within a blood vessel.  There was no blood retrieved on aspiration.    Following is a summary of the muscles injected  And the amount of Botulinum toxin used:  Dilution 200 units of Botox was reconstituted with 4 ml of preservative free normal saline. Time of reconstitution: At the time of the office visit (<30 minutes prior to injection)   Injections  155 total units of Botox was injected with a 30 gauge needle.  Injection Sites: L occipitalis: 15 units- 3 sites  R occiptalis: 15 units- 3 sites  L upper trapezius: 15 units- 3 sites R upper trapezius: 15 units- 3 sits          L paraspinal: 10 units- 2 sites R paraspinal: 10 units- 2 sites  Face L frontalis(2 injection sites):10 units   R frontalis(2 injection sites):10 units         L corrugator: 5 units   R corrugator: 5 units           Procerus: 5 units   L temporalis: 20 units R temporalis: 20 units   Agent:  200 units of botulinum Type  A (Onobotulinum Toxin type A) was reconstituted with 4 ml of preservative free normal saline.  Time of reconstitution: At the time of the office visit (<30 minutes prior to injection)     Total injected (Units): 155  Total wasted (Units): 0  Patient tolerated procedure well without complications.   Reinjection is anticipated in 3 months. Return to clinic in 6 weeks.   

## 2017-07-03 ENCOUNTER — Ambulatory Visit: Payer: Self-pay | Admitting: Neurology

## 2017-07-29 ENCOUNTER — Other Ambulatory Visit (INDEPENDENT_AMBULATORY_CARE_PROVIDER_SITE_OTHER): Payer: 59

## 2017-07-29 ENCOUNTER — Encounter: Payer: Self-pay | Admitting: Nurse Practitioner

## 2017-07-29 ENCOUNTER — Ambulatory Visit (INDEPENDENT_AMBULATORY_CARE_PROVIDER_SITE_OTHER): Payer: 59 | Admitting: Nurse Practitioner

## 2017-07-29 ENCOUNTER — Other Ambulatory Visit: Payer: Self-pay

## 2017-07-29 ENCOUNTER — Other Ambulatory Visit: Payer: Self-pay | Admitting: Nurse Practitioner

## 2017-07-29 ENCOUNTER — Other Ambulatory Visit (HOSPITAL_COMMUNITY)
Admission: RE | Admit: 2017-07-29 | Discharge: 2017-07-29 | Disposition: A | Discharge status: Home / Self Care | Payer: 59 | Source: Ambulatory Visit | Attending: Internal Medicine | Admitting: Internal Medicine

## 2017-07-29 VITALS — BP 122/80 | HR 69 | Temp 99.1°F | Resp 16 | Ht 69.0 in | Wt 263.0 lb

## 2017-07-29 DIAGNOSIS — N898 Other specified noninflammatory disorders of vagina: Secondary | ICD-10-CM | POA: Diagnosis not present

## 2017-07-29 DIAGNOSIS — T148XXA Other injury of unspecified body region, initial encounter: Secondary | ICD-10-CM

## 2017-07-29 LAB — WET PREP, GENITAL
Trich, Wet Prep: NONE SEEN — AB
Yeast Wet Prep HPF POC: NONE SEEN — AB

## 2017-07-29 LAB — CBC WITH DIFFERENTIAL/PLATELET
Basophils Absolute: 0.1 10*3/uL (ref 0.0–0.1)
Basophils Relative: 1.3 % (ref 0.0–3.0)
Eosinophils Absolute: 0.1 10*3/uL (ref 0.0–0.7)
Eosinophils Relative: 1.3 % (ref 0.0–5.0)
HCT: 36.6 % (ref 36.0–46.0)
Hemoglobin: 12.3 g/dL (ref 12.0–15.0)
Lymphocytes Relative: 42.4 % (ref 12.0–46.0)
Lymphs Abs: 2.7 10*3/uL (ref 0.7–4.0)
MCHC: 33.5 g/dL (ref 30.0–36.0)
MCV: 90.8 fl (ref 78.0–100.0)
Monocytes Absolute: 0.3 10*3/uL (ref 0.1–1.0)
Monocytes Relative: 4.5 % (ref 3.0–12.0)
Neutro Abs: 3.2 10*3/uL (ref 1.4–7.7)
Neutrophils Relative %: 50.5 % (ref 43.0–77.0)
Platelets: 290 10*3/uL (ref 150.0–400.0)
RBC: 4.03 Mil/uL (ref 3.87–5.11)
RDW: 14.6 % (ref 11.5–15.5)
WBC: 6.4 10*3/uL (ref 4.0–10.5)

## 2017-07-29 LAB — APTT: aPTT: 31.3 s (ref 23.4–32.7)

## 2017-07-29 LAB — PROTIME-INR
INR: 1 ratio (ref 0.8–1.0)
Prothrombin Time: 12 s (ref 9.6–13.1)

## 2017-07-29 MED ORDER — METRONIDAZOLE 500 MG PO TABS: 500.0000 mg | ORAL_TABLET | Freq: Two times a day (BID) | ORAL | 0 refills | Status: DC

## 2017-07-29 MED FILL — metroNIDAZOLE 500 MG TABS: 500 | 7 days supply | Qty: 14 | Fill #0

## 2017-07-29 NOTE — Progress Notes (Signed)
Name: Kayla Owens   MRN: [id number]    DOB: [birth year]   Date:07/29/2017       Progress Note  Subjective  Chief Complaint  Chief Complaint  Patient presents with  . STD check    vaginal discharge and itching     HPI Requests evaluation of vaginal discharge and bruising  Vaginal discharge- Reports vaginal discharge and itching for 2 days Describes discharge as "white cottage cheese" She is sexually active with one partner, no condom use. She would like STD screening She had her last menstrual period on 4/12, seems to notice increased vaginal itching and irritation after her periods She reports low grade temperature and nausea She denies weakness, fatigue, abdominal pain, back pain, vaginal bleeding, urinary frequency, hematuria  Bruising- She has noticed increased bruising to her upper and lower extremities over the past several months. She notices the bruises randomly and dont seem to be related to any injury She denies syncope, lightheadedness, pain, nosebleeds, gingival bleeding, rectal bleeding, personal history of bleeding disorder Her mother has a history of blood clots which she says has made her more concerned about the bruising  Patient Active Problem List   Diagnosis Date Noted  . Routine adult health maintenance 11/04/2016  . Class 2 obesity due to excess calories without serious comorbidity with body mass index (BMI) of 38.0 to 38.9 in adult 11/04/2016  . Migraine with aura and without status migrainosus, not intractable 10/03/2016  . Moderate persistent asthma 10/03/2016  . Anxiety and depression 10/03/2016    Social History   Tobacco Use  . Smoking status: Current Some Day Smoker    Types: Cigarettes  . Smokeless tobacco: Never Used  . Tobacco comment: smokes black and mild cigars QOD  Substance Use Topics  . Alcohol use: No     Current Outpatient Medications:  .  albuterol (PROVENTIL HFA;VENTOLIN HFA) 108 (90 Base) MCG/ACT inhaler, Inhale 1-2 puffs  into the lungs every 6 (six) hours as needed for wheezing or shortness of breath., Disp: 1 Inhaler, Rfl: 3 .  baclofen (LIORESAL) 10 MG tablet, Take 10 mg by mouth 3 (three) times daily., Disp: , Rfl:  .  beclomethasone (QVAR) 40 MCG/ACT inhaler, Inhale 2 puffs into the lungs daily., Disp: 1 Inhaler, Rfl: 3 .  citalopram (CELEXA) 20 MG tablet, Take 1.5 tablets (30 mg total) by mouth daily., Disp: 45 tablet, Rfl: 3 .  montelukast (SINGULAIR) 10 MG tablet, Take 1 tablet (10 mg total) by mouth at bedtime., Disp: 90 tablet, Rfl: 1 .  zolmitriptan (ZOMIG) 5 MG nasal solution, Place 1 spray into the nose as needed for migraine., Disp: 6 Units, Rfl: 0 .  zonisamide (ZONEGRAN) 25 MG capsule, Take 25 mg by mouth 4 (four) times daily., Disp: , Rfl:   No Known Allergies  ROS  No other specific complaints in a complete review of systems (except as listed in HPI above).  Objective  Vitals:   07/29/17 0918  BP: 122/80  Pulse: 69  Resp: 16  Temp: 99.1 F (37.3 C)  TempSrc: Oral  SpO2: 97%  Weight: 263 lb (119.3 kg)  Height: 5\' 9"  (1.753 m)    Body mass index is 38.84 kg/m.  Nursing Note and Vital Signs reviewed.  Physical Exam Vital signs reviewed. Chaperone in room for pelvic exam. Constitutional: Patient appears well-developed and well-nourished. No distress.  HENT: Head: Normocephalic and atraumatic. Nose: Nose normal. Mouth/Throat: Oropharynx is clear and moist.  Eyes: Conjunctivae and EOM are normal. Pupils are equal,  round, and reactive to light. No scleral icterus.  Neck: Normal range of motion. Neck supple. Cardiovascular: Normal rate, regular rhythm and Distal pulses intact. Pulmonary/Chest: Effort normal and breath sounds normal. No respiratory distress. Abdominal: Soft. Bowel sounds are normal, no distension. There is no tenderness. no masses Genitourinary: Vagina normal. There is no rash, tenderness or lesion on the right labia. There is no rash, tenderness or lesion on the  left labia. No tenderness in the vagina. Thick white discharge noted Musculoskeletal: Normal range of motion, no joint effusions. No gross deformities Neurological: She is alert and oriented to person, place, and time.  Coordination, balance, strength, speech and gait are normal.  Skin: Skin is warm and dry. No rash noted. No erythema. Scattered ecchymosis to BLE Psychiatric: Patient has a normal mood and affect. behavior is normal. Judgment and thought content normal.   Assessment & Plan -Reviewed Health Maintenance: up to date  1. Bruising Labs today- will F/U with further recommendations/treatment pending lab results - CBC with Differential/Platelet; Future - INR/PT; Future - PTT; Future  2. Vaginal discharge Labs today- will F/U with further recommendations/treatment pending lab results Patient also requested HIV testing which was not ordered at her visit, but a future order was placed and she was notified to return to lab for test  Discussed use of probiotic to prevent bacterial vaginal infection F/u for new, worsening symptoms - Wet prep, genital; Future - Urine cytology ancillary only; Future

## 2017-07-29 NOTE — Patient Instructions (Addendum)
Please head downstairs for lab work/x-rays. If any of your test results are critically abnormal, you will be contacted right away. Your results may be released to your MyChart for viewing before I am able to provide you with my response. I will contact you within a week about your test results and any recommendations for abnormalities.  I will follow up with you with a plan when I get your lab results back. You can try a probiotic called lactobacillus acidophilus daily to see if this helps your vaginal symptoms.   Lactobacillus Oral formulations What is this medicine? LACTOBACILLUS (lak toh buh SIL uhs) is a supplement. It is used to help the normal balance of bacteria in the colon. This may treat or prevent diarrhea caused by an infection or by antibiotics. The FDA has not approved this supplement for any medical use. This supplement may be used for other purposes; ask your health care provider or pharmacist if you have questions. This medicine may be used for other purposes; ask your health care provider or pharmacist if you have questions. COMMON BRAND NAME(S): BioGaia, Culturelle, Culturelle Kids, Florajen, Floranex, Floranex Granules, Probiotic Acidophilus What should I tell my health care provider before I take this medicine? They need to know if you have any of these conditions: -chronic disease -immune system problems -prosthetic heart valve or valvular heart disease -an unusual or allergic reaction to Lactobacillus, any medicines, lactose or milk, other foods, dyes, or preservatives -pregnant or trying to get pregnant -breast-feeding How should I use this medicine? Take this medicine by mouth with a small amount of milk, fruit juice, or water. Follow the directions on the package labeling, or take as directed by your health care professional. This medicine can be taken with cereal or other food. Do not take this medicine more often than directed. Contact your pediatrician regarding  the use of this medicine in children. Special care may be needed. This medicine is not recommended for children under 6 years old unless prescribed by a doctor. Overdosage: If you think you have taken too much of this medicine contact a poison control center or emergency room at once. NOTE: This medicine is only for you. Do not share this medicine with others. What if I miss a dose? If you miss a dose, take it as soon as you can. If it is almost time for your next dose, take only that dose. Do not take double or extra doses. What may interact with this medicine? Interactions are not expected. This list may not describe all possible interactions. Give your health care provider a list of all the medicines, herbs, non-prescription drugs, or dietary supplements you use. Also tell them if you smoke, drink alcohol, or use illegal drugs. Some items may interact with your medicine. What should I watch for while using this medicine? See your doctor if your symptoms do not get better or if they get worse. Do not take this supplement for more than 2 days or if you have a fever unless your doctor tells you to. If you have allergies to milk or you are sensitive to lactose, do not use this supplement. What side effects may I notice from receiving this medicine? Side effects that you should report to your doctor or health care professional as soon as possible: -allergic reactions like skin rash, itching or hives, swelling of the face, lips, or tongue -breathing problems -severe nausea, vomiting -unusually weak or tired Side effects that usually do not require medical attention (report  to your doctor or health care professional if they continue or are bothersome): -hiccups -stomach gas This list may not describe all possible side effects. Call your doctor for medical advice about side effects. You may report side effects to FDA at 1-800-FDA-1088. Where should I keep my medicine? Keep out of the reach of  children. Store in the refrigerator or as directed on the package label. Do not freeze. Throw away any unused medicine after the expiration date. NOTE: This sheet is a summary. It may not cover all possible information. If you have questions about this medicine, talk to your doctor, pharmacist, or health care provider.  2018 Elsevier/Gold Standard (2011-03-21 07:41:13)

## 2017-07-30 ENCOUNTER — Other Ambulatory Visit: Payer: Self-pay | Admitting: Nurse Practitioner

## 2017-07-30 DIAGNOSIS — Z113 Encounter for screening for infections with a predominantly sexual mode of transmission: Secondary | ICD-10-CM

## 2017-07-30 LAB — URINE CYTOLOGY ANCILLARY ONLY
Chlamydia: NEGATIVE
Neisseria Gonorrhea: NEGATIVE
Trichomonas: NEGATIVE

## 2017-07-31 ENCOUNTER — Telehealth: Payer: Self-pay | Admitting: Nurse Practitioner

## 2017-07-31 NOTE — Telephone Encounter (Signed)
Copied from CRM [phone]. Topic: Quick Communication - Lab Results >> Jul 31, 2017  2:11 PM [name], Taylor E, CMA wrote: Called patient to inform them of 07/29/17 lab results. When patient returns call, triage nurse may disclose results.   Pt is at work and would like her results left on her voicemail.

## 2017-08-01 ENCOUNTER — Encounter: Payer: Self-pay | Admitting: Nurse Practitioner

## 2017-08-13 ENCOUNTER — Telehealth: Payer: Self-pay | Admitting: Nurse Practitioner

## 2017-08-13 NOTE — Telephone Encounter (Signed)
Please advise 

## 2017-08-13 NOTE — Telephone Encounter (Signed)
Copied from CRM [id number]. Topic: Quick Communication - See Telephone Encounter >> Aug 13, 2017  4:03 PM Lorrine Kin, NT wrote: CRM for notification. See Telephone encounter for: 08/13/17. Patient states that she is still having a white thin discharge, not as heavy or thick, but she is having constant itching. Tried a probiotic, but nothing is helping.States that she has finished her antibiotic and has not been sexually active since taking the medication. Please advise. CB#: [phone]([facility] number- will be there until 7:30pm tonight and tomorrow as well).

## 2017-08-14 MED ORDER — FLUCONAZOLE 150 MG PO TABS: 150.0000 mg | ORAL_TABLET | Freq: Once | ORAL | 0 refills | Status: AC

## 2017-08-14 MED ORDER — TERCONAZOLE 0.4 % VA CREA: 1.0000 | TOPICAL_CREAM | Freq: Every day | VAGINAL | 0 refills | Status: DC

## 2017-08-14 MED ORDER — FLUCONAZOLE 150 MG PO TABS: 150.0000 mg | ORAL_TABLET | Freq: Once | ORAL | 0 refills | Status: DC

## 2017-08-14 NOTE — Telephone Encounter (Signed)
Let's give her a trial of Diflucan and Terazol; she may have developed a secondary yeast infection from recent treatment with Flagyl; will have to be seen ere or with GYN if symptoms persist.

## 2017-08-14 NOTE — Telephone Encounter (Signed)
I would like for her to see a gynecologist for further evaluation. Since she is having recurrent symptoms I would like to get their help to see if we can get her symptoms under control. Can you please place referral to gyn for recurrent vaginal infections and let her know?

## 2017-08-14 NOTE — Telephone Encounter (Signed)
Pt states she has a GYN appointment in June. She is asking for something to help with vaginal itching, burning, and discharge. She asking for something ASAP. Thank you!

## 2017-08-14 NOTE — Telephone Encounter (Signed)
Pt aware of response.  

## 2017-08-14 NOTE — Telephone Encounter (Signed)
Tried calling pt to let her know response. Phone was turned off. Will try back later.

## 2017-09-04 ENCOUNTER — Ambulatory Visit (INDEPENDENT_AMBULATORY_CARE_PROVIDER_SITE_OTHER): Payer: 59 | Admitting: Neurology

## 2017-09-04 ENCOUNTER — Encounter

## 2017-09-04 DIAGNOSIS — G43719 Chronic migraine without aura, intractable, without status migrainosus: Secondary | ICD-10-CM

## 2017-09-04 MED ORDER — ONABOTULINUMTOXINA 100 UNITS IJ SOLR
155.0000 [IU] | Freq: Once | INTRAMUSCULAR | Status: AC
Start: 2017-09-04 — End: 2017-09-04
  Administered 2017-09-04: 155 [IU] via INTRAMUSCULAR

## 2017-09-04 NOTE — Progress Notes (Signed)
[facility]   Procedure Note Botox  Attending: Dr. Sandee Bernath  Preoperative Diagnosis(es): Chronic migraine  Consent obtained from: The patient Benefits discussed included, but were not limited to decreased muscle tightness, increased joint range of motion, and decreased pain.  Risk discussed included, but were not limited pain and discomfort, bleeding, bruising, excessive weakness, venous thrombosis, muscle atrophy and dysphagia.  Anticipated outcomes of the procedure as well as he risks and benefits of the alternatives to the procedure, and the roles and tasks of the personnel to be involved, were discussed with the patient, and the patient consents to the procedure and agrees to proceed. A copy of the patient medication guide was given to the patient which explains the blackbox warning.  Patients identity and treatment sites confirmed Yes.  .  Details of Procedure: Skin was cleaned with alcohol. Prior to injection, the needle plunger was aspirated to make sure the needle was not within a blood vessel.  There was no blood retrieved on aspiration.    Following is a summary of the muscles injected  And the amount of Botulinum toxin used:  Dilution 200 units of Botox was reconstituted with 4 ml of preservative free normal saline. Time of reconstitution: At the time of the office visit (<30 minutes prior to injection)   Injections  155 total units of Botox was injected with a 30 gauge needle.  Injection Sites: L occipitalis: 15 units- 3 sites  R occiptalis: 15 units- 3 sites  L upper trapezius: 15 units- 3 sites R upper trapezius: 15 units- 3 sits          L paraspinal: 10 units- 2 sites R paraspinal: 10 units- 2 sites  Face L frontalis(2 injection sites):10 units   R frontalis(2 injection sites):10 units         L corrugator: 5 units   R corrugator: 5 units           Procerus: 5 units   L temporalis: 20 units R temporalis: 20 units   Agent:  200 units of botulinum Type A  (Onobotulinum Toxin type A) was reconstituted with 4 ml of preservative free normal saline.  Time of reconstitution: At the time of the office visit (<30 minutes prior to injection)     Total injected (Units): 155  Total wasted (Units): 5  Patient tolerated procedure well without complications.   Reinjection is anticipated in 3 months.   

## 2017-09-09 ENCOUNTER — Emergency Department (HOSPITAL_COMMUNITY)
Admission: EM | Admit: 2017-09-09 | Discharge: 2017-09-09 | Disposition: A | Discharge status: Home / Self Care | Payer: 59 | Attending: Emergency Medicine | Admitting: Emergency Medicine

## 2017-09-09 ENCOUNTER — Encounter (HOSPITAL_COMMUNITY): Payer: Self-pay

## 2017-09-09 ENCOUNTER — Other Ambulatory Visit (INDEPENDENT_AMBULATORY_CARE_PROVIDER_SITE_OTHER): Payer: 59

## 2017-09-09 ENCOUNTER — Encounter: Payer: Self-pay | Admitting: Nurse Practitioner

## 2017-09-09 ENCOUNTER — Ambulatory Visit (INDEPENDENT_AMBULATORY_CARE_PROVIDER_SITE_OTHER): Payer: 59 | Admitting: Nurse Practitioner

## 2017-09-09 VITALS — BP 124/80 | HR 56 | Temp 98.3°F | Resp 16 | Ht 69.0 in | Wt 256.0 lb

## 2017-09-09 DIAGNOSIS — Z114 Encounter for screening for human immunodeficiency virus [HIV]: Secondary | ICD-10-CM

## 2017-09-09 DIAGNOSIS — Z6838 Body mass index (BMI) 38.0-38.9, adult: Secondary | ICD-10-CM | POA: Diagnosis not present

## 2017-09-09 DIAGNOSIS — Z5321 Procedure and treatment not carried out due to patient leaving prior to being seen by health care provider: Secondary | ICD-10-CM | POA: Insufficient documentation

## 2017-09-09 DIAGNOSIS — Z6837 Body mass index (BMI) 37.0-37.9, adult: Secondary | ICD-10-CM

## 2017-09-09 DIAGNOSIS — E669 Obesity, unspecified: Secondary | ICD-10-CM | POA: Diagnosis not present

## 2017-09-09 DIAGNOSIS — F32A Depression, unspecified: Secondary | ICD-10-CM

## 2017-09-09 DIAGNOSIS — Z0000 Encounter for general adult medical examination without abnormal findings: Secondary | ICD-10-CM

## 2017-09-09 DIAGNOSIS — F329 Major depressive disorder, single episode, unspecified: Secondary | ICD-10-CM | POA: Diagnosis not present

## 2017-09-09 DIAGNOSIS — N939 Abnormal uterine and vaginal bleeding, unspecified: Secondary | ICD-10-CM | POA: Diagnosis not present

## 2017-09-09 DIAGNOSIS — R1084 Generalized abdominal pain: Secondary | ICD-10-CM | POA: Diagnosis present

## 2017-09-09 DIAGNOSIS — Z13220 Encounter for screening for lipoid disorders: Secondary | ICD-10-CM

## 2017-09-09 DIAGNOSIS — F419 Anxiety disorder, unspecified: Secondary | ICD-10-CM

## 2017-09-09 DIAGNOSIS — E6609 Other obesity due to excess calories: Secondary | ICD-10-CM | POA: Diagnosis not present

## 2017-09-09 LAB — COMPREHENSIVE METABOLIC PANEL
ALT: 9 U/L (ref 0–35)
AST: 10 U/L (ref 0–37)
Albumin: 4 g/dL (ref 3.5–5.2)
Alkaline Phosphatase: 45 U/L (ref 39–117)
BUN: 8 mg/dL (ref 6–23)
CO2: 27 mEq/L (ref 19–32)
Calcium: 9.4 mg/dL (ref 8.4–10.5)
Chloride: 106 mEq/L (ref 96–112)
Creatinine, Ser: 0.87 mg/dL (ref 0.40–1.20)
GFR: 100.24 mL/min (ref >60.00)
Glucose, Bld: 95 mg/dL (ref 70–99)
Potassium: 3.9 mEq/L (ref 3.5–5.1)
Sodium: 139 mEq/L (ref 135–145)
Total Bilirubin: 0.3 mg/dL (ref 0.2–1.2)
Total Protein: 7.1 g/dL (ref 6.0–8.3)

## 2017-09-09 LAB — LIPID PANEL
Cholesterol: 153 mg/dL (ref 0–200)
HDL: 48 mg/dL (ref >39.00)
LDL Cholesterol: 87 mg/dL (ref 0–99)
NonHDL: 105.42
Total CHOL/HDL Ratio: 3
Triglycerides: 93 mg/dL (ref 0.0–149.0)
VLDL: 18.6 mg/dL (ref 0.0–40.0)

## 2017-09-09 LAB — TSH: TSH: 2.41 u[IU]/mL (ref 0.35–4.50)

## 2017-09-09 LAB — HEMOGLOBIN A1C: Hgb A1c MFr Bld: 5.8 % (ref 4.6–6.5)

## 2017-09-09 LAB — CBC WITH DIFFERENTIAL/PLATELET
Abs Immature Granulocytes: 0 10*3/uL (ref 0.0–0.1)
Basophils Absolute: 0 10*3/uL (ref 0.0–0.1)
Basophils Relative: 1 %
Eosinophils Absolute: 0.1 10*3/uL (ref 0.0–0.7)
Eosinophils Relative: 1 %
HCT: 39.1 % (ref 36.0–46.0)
Hemoglobin: 12.5 g/dL (ref 12.0–15.0)
Immature Granulocytes: 0 %
Lymphocytes Relative: 50 %
Lymphs Abs: 3.5 10*3/uL (ref 0.7–4.0)
MCH: 29.6 pg (ref 26.0–34.0)
MCHC: 32 g/dL (ref 30.0–36.0)
MCV: 92.4 fL (ref 78.0–100.0)
Monocytes Absolute: 0.4 10*3/uL (ref 0.1–1.0)
Monocytes Relative: 5 %
Neutro Abs: 3.1 10*3/uL (ref 1.7–7.7)
Neutrophils Relative %: 43 %
Platelets: 301 10*3/uL (ref 150–400)
RBC: 4.23 MIL/uL (ref 3.87–5.11)
RDW: 14.1 % (ref 11.5–15.5)
WBC: 7.2 10*3/uL (ref 4.0–10.5)

## 2017-09-09 LAB — I-STAT BETA HCG BLOOD, ED (MC, WL, AP ONLY): I-stat hCG, quantitative: <5 m[IU]/mL (ref <5)

## 2017-09-09 NOTE — Progress Notes (Signed)
Name: Kayla Owens   MRN: [id number]    DOB: December 11, 1990   Date:09/09/2017       Progress Note  Subjective  Chief Complaint  Chief Complaint  Patient presents with  . CPE    fasting    HPI  Patient presents for annual CPE.  Diet, Exercise: has not really been watching her diet or exercising over past few months, but over past week re-commited to exercise and has been working out in the pool, thinking of working on her diet to lose weight  USPSTF grade A and B recommendations  Depression: maintained on celexa 30mg  daily-dosage was increased from 20 daily in November 2018 Feels her mood has improved on the increased dosage of celexa. Sometimes feels worried or anxious but overall better Denies thoughts of hurting herself or others  Routinely following with psychology for counseling Depression screen [facility] 2/9 02/06/2017 10/03/2016  Decreased Interest 0 2  Down, Depressed, Hopeless 2 3  PHQ - 2 Score 2 5  Altered sleeping 3 3  Tired, decreased energy 2 2  Change in appetite 3 3  Feeling bad or failure about yourself  2 2  Trouble concentrating 2 1  Moving slowly or fidgety/restless 2 1  Suicidal thoughts 2 2  PHQ-9 Score 18 19   Hypertension: BP Readings from Last 3 Encounters:  09/09/17 124/80  07/29/17 122/80  02/06/17 120/86   Obesity: Wt Readings from Last 3 Encounters:  09/09/17 256 lb (116.1 kg)  07/29/17 263 lb (119.3 kg)  02/06/17 256 lb (116.1 kg)   BMI Readings from Last 3 Encounters:  09/09/17 37.80 kg/m  07/29/17 38.84 kg/m  02/06/17 37.80 kg/m    Alcohol: no  Tobacco use: no, former-quit in January   HIV: HIV screening ordered today STD testing and prevention (chl/gon/syphilis): no concerns, declines testing  Intimate partner violence: feels safe  Vaccinations: up to date  Advanced Care Planning: A voluntary discussion about advance care planning including the explanation and discussion of advance directives.  Discussed health care proxy and  Living will, and the patient DOES NOT have a living will at present time. If patient does have living will, I have requested they bring this to the clinic to be scanned in to their chart.  Cervical cancer screening: PAP up to date-recently established with GYN provider in June for routine womens health care and management of recurrent BV  Lipids: lipid panel today Lab Results  Component Value Date   CHOL 148 02/06/2017   Lab Results  Component Value Date   HDL 55.00 02/06/2017   Lab Results  Component Value Date   LDLCALC 82 02/06/2017   Lab Results  Component Value Date   TRIG 54.0 02/06/2017   Lab Results  Component Value Date   CHOLHDL 3 02/06/2017   No results found for: LDLDIRECT  Glucose: A1c today Glucose, Bld  Date Value Ref Range Status  02/06/2017 94 70 - 99 mg/dL Final    Skin cancer: does not wears sunscreen   Aspirin: not indicated ECG: not indicated   Patient Active Problem List   Diagnosis Date Noted  . Routine adult health maintenance 11/04/2016  . Class 2 obesity due to excess calories without serious comorbidity with body mass index (BMI) of 38.0 to 38.9 in adult 11/04/2016  . Migraine with aura and without status migrainosus, not intractable 10/03/2016  . Moderate persistent asthma 10/03/2016  . Anxiety and depression 10/03/2016    Past Surgical History:  Procedure Laterality Date  .  biopsy of cervix    . WISDOM TOOTH EXTRACTION      Family History  Problem Relation Age of Onset  . Arthritis Mother   . Hypertension Mother   . Lupus Mother   . Heart disease Maternal Grandmother   . Heart disease Maternal Grandfather   . Arthritis Paternal Grandmother   . Stomach cancer Paternal Grandfather     Social History   Socioeconomic History  . Marital status: Single    Spouse name: Not on file  . Number of children: 0  . Years of education: 19  . Highest education level: Not on file  Occupational History  . Occupation: Designer, multimedia: [employer]  Social Needs  . Financial resource strain: Not on file  . Food insecurity:    Worry: Not on file    Inability: Not on file  . Transportation needs:    Medical: Not on file    Non-medical: Not on file  Tobacco Use  . Smoking status: Current Some Day Smoker    Types: Cigarettes  . Smokeless tobacco: Never Used  . Tobacco comment: smokes black and mild cigars QOD  Substance and Sexual Activity  . Alcohol use: No  . Drug use: No  . Sexual activity: Yes  Lifestyle  . Physical activity:    Days per week: Not on file    Minutes per session: Not on file  . Stress: Not on file  Relationships  . Social connections:    Talks on phone: Not on file    Gets together: Not on file    Attends religious service: Not on file    Active member of club or organization: Not on file    Attends meetings of clubs or organizations: Not on file    Relationship status: Not on file  . Intimate partner violence:    Fear of current or ex partner: Not on file    Emotionally abused: Not on file    Physically abused: Not on file    Forced sexual activity: Not on file  Other Topics Concern  . Not on file  Social History Narrative   Fun/Hobby: Swimming / pool   Single, lives with boyfriend, avoids caffeine. Exercises 2x a week     Current Outpatient Medications:  .  albuterol (PROVENTIL HFA;VENTOLIN HFA) 108 (90 Base) MCG/ACT inhaler, Inhale 1-2 puffs into the lungs every 6 (six) hours as needed for wheezing or shortness of breath., Disp: 1 Inhaler, Rfl: 3 .  beclomethasone (QVAR) 40 MCG/ACT inhaler, Inhale 2 puffs into the lungs daily., Disp: 1 Inhaler, Rfl: 3 .  citalopram (CELEXA) 20 MG tablet, Take 1.5 tablets (30 mg total) by mouth daily., Disp: 45 tablet, Rfl: 3 .  montelukast (SINGULAIR) 10 MG tablet, Take 1 tablet (10 mg total) by mouth at bedtime., Disp: 90 tablet, Rfl: 1 .  terconazole (TERAZOL 7) 0.4 % vaginal cream, Place 1 applicator vaginally at bedtime., Disp: 45 g,  Rfl: 0 .  zolmitriptan (ZOMIG) 5 MG nasal solution, Place 1 spray into the nose as needed for migraine., Disp: 6 Units, Rfl: 0 .  zonisamide (ZONEGRAN) 25 MG capsule, Take 25 mg by mouth 4 (four) times daily., Disp: , Rfl:   No Known Allergies   ROS  Constitutional: Negative for fever or weight change.  Respiratory: Negative for cough and shortness of breath.   Cardiovascular: Negative for chest pain or palpitations.  Gastrointestinal: Negative for abdominal pain, no bowel changes.  Musculoskeletal: Negative  for gait problem or joint swelling.  Skin: Negative for rash.  Neurological: Negative for dizziness or headache.  No other specific complaints in a complete review of systems (except as listed in HPI above).   Objective  Vitals:   09/09/17 0833  BP: 124/80  Pulse: (!) 56  Resp: 16  Temp: 98.3 F (36.8 C)  TempSrc: Oral  SpO2: 98%  Weight: 256 lb (116.1 kg)  Height: 5\' 9"  (1.753 m)    Body mass index is 37.8 kg/m.  Physical Exam Vital signs reviewed Constitutional: Patient appears well-developed and well-nourished. No distress.  HENT: Head: Normocephalic and atraumatic. Ears: B TMs ok, no erythema or effusion; Nose: Nose normal. Mouth/Throat: Oropharynx is clear and moist. No oropharyngeal exudate.  Eyes: Conjunctivae and EOM are normal. Pupils are equal, round, and reactive to light. No scleral icterus.  Neck: Normal range of motion. Neck supple. No cervical adenopathy. No thyromegaly present.  Cardiovascular: Normal rate, regular rhythm and normal heart sounds.  No murmur heard. No BLE edema. Distal pulses intact Pulmonary/Chest: Effort normal and breath sounds normal. No respiratory distress. Abdominal: Soft. Bowel sounds are normal, no distension. There is no tenderness. no masses Breast: defd to GYN FEMALE GENITALIA:  Defd to GYN Musculoskeletal: Normal range of motion. No gross deformities Neurological: She is alert and oriented to person, place, and time.  No cranial nerve deficit. Coordination, balance, strength, speech and gait are normal.  Skin: Skin is warm and dry. No rash noted. No erythema.  Psychiatric: Patient has a normal mood and affect. behavior is normal. Judgment and thought content normal.  Fall Risk: Fall Risk  01/13/2017  Falls in the past year? Yes  Number falls in past yr: 2 or more  Follow up Falls evaluation completed    Assessment & Plan RTC in 1 year for CPE, sooner if needed CBC w/diff on 07/29/17 WNL

## 2017-09-09 NOTE — Assessment & Plan Note (Signed)
Stable Continue celexa, counseling F/U for new, worsening symptoms

## 2017-09-09 NOTE — Assessment & Plan Note (Signed)
Discussed the role of healthy diet and exercise in the management of obesity She is very interested in improving her diet, referral to nutrition education placed today - Comprehensive metabolic panel; Future - Hemoglobin A1c; Future - Ambulatory referral to Nutrition and Diabetic Education - TSH; Future

## 2017-09-09 NOTE — Patient Instructions (Addendum)
Please head downstairs for lab work/x-rays. If any of your test results are critically abnormal, you will be contacted right away. Your results may be released to your MyChart for viewing before I am able to provide you with my response. I will contact you within a week about your test results and any recommendations for abnormalities.  I have placed a referral to nutrition eduation . Our office will call you to schedule this appointment. You should hear from our office in 7-10 days.   Health Maintenance, Female Adopting a healthy lifestyle and getting preventive care can go a long way to promote health and wellness. Talk with your health care provider about what schedule of regular examinations is right for you. This is a good chance for you to check in with your provider about disease prevention and staying healthy. In between checkups, there are plenty of things you can do on your own. Experts have done a lot of research about which lifestyle changes and preventive measures are most likely to keep you healthy. Ask your health care provider for more information. Weight and diet Eat a healthy diet  Be sure to include plenty of vegetables, fruits, low-fat dairy products, and lean protein.  Do not eat a lot of foods high in solid fats, added sugars, or salt.  Get regular exercise. This is one of the most important things you can do for your health. ? Most adults should exercise for at least 150 minutes each week. The exercise should increase your heart rate and make you sweat (moderate-intensity exercise). ? Most adults should also do strengthening exercises at least twice a week. This is in addition to the moderate-intensity exercise.  Maintain a healthy weight  Body mass index (BMI) is a measurement that can be used to identify possible weight problems. It estimates body fat based on height and weight. Your health care provider can help determine your BMI and help you achieve or maintain a  healthy weight.  For females 65 years of age and older: ? A BMI below 18.5 is considered underweight. ? A BMI of 18.5 to 24.9 is normal. ? A BMI of 25 to 29.9 is considered overweight. ? A BMI of 30 and above is considered obese.  Watch levels of cholesterol and blood lipids  You should start having your blood tested for lipids and cholesterol at 27 years of age, then have this test every 5 years.  You may need to have your cholesterol levels checked more often if: ? Your lipid or cholesterol levels are high. ? You are older than 27 years of age. ? You are at high risk for heart disease.  Cancer screening Lung Cancer  Lung cancer screening is recommended for adults 26-8 years old who are at high risk for lung cancer because of a history of smoking.  A yearly low-dose CT scan of the lungs is recommended for people who: ? Currently smoke. ? Have quit within the past 15 years. ? Have at least a 30-pack-year history of smoking. A pack year is smoking an average of one pack of cigarettes a day for 1 year.  Yearly screening should continue until it has been 15 years since you quit.  Yearly screening should stop if you develop a health problem that would prevent you from having lung cancer treatment.  Breast Cancer  Practice breast self-awareness. This means understanding how your breasts normally appear and feel.  It also means doing regular breast self-exams. Let your health care provider  know about any changes, no matter how small.  If you are in your 20s or 30s, you should have a clinical breast exam (CBE) by a health care provider every 1-3 years as part of a regular health exam.  If you are 39 or older, have a CBE every year. Also consider having a breast X-ray (mammogram) every year.  If you have a family history of breast cancer, talk to your health care provider about genetic screening.  If you are at high risk for breast cancer, talk to your health care provider about  having an MRI and a mammogram every year.  Breast cancer gene (BRCA) assessment is recommended for women who have family members with BRCA-related cancers. BRCA-related cancers include: ? Breast. ? Ovarian. ? Tubal. ? Peritoneal cancers.  Results of the assessment will determine the need for genetic counseling and BRCA1 and BRCA2 testing.  Cervical Cancer Your health care provider may recommend that you be screened regularly for cancer of the pelvic organs (ovaries, uterus, and vagina). This screening involves a pelvic examination, including checking for microscopic changes to the surface of your cervix (Pap test). You may be encouraged to have this screening done every 3 years, beginning at age 92.  For women ages 19-65, health care providers may recommend pelvic exams and Pap testing every 3 years, or they may recommend the Pap and pelvic exam, combined with testing for human papilloma virus (HPV), every 5 years. Some types of HPV increase your risk of cervical cancer. Testing for HPV may also be done on women of any age with unclear Pap test results.  Other health care providers may not recommend any screening for nonpregnant women who are considered low risk for pelvic cancer and who do not have symptoms. Ask your health care provider if a screening pelvic exam is right for you.  If you have had past treatment for cervical cancer or a condition that could lead to cancer, you need Pap tests and screening for cancer for at least 20 years after your treatment. If Pap tests have been discontinued, your risk factors (such as having a new sexual partner) need to be reassessed to determine if screening should resume. Some women have medical problems that increase the chance of getting cervical cancer. In these cases, your health care provider may recommend more frequent screening and Pap tests.  Colorectal Cancer  This type of cancer can be detected and often prevented.  Routine colorectal cancer  screening usually begins at 27 years of age and continues through 27 years of age.  Your health care provider may recommend screening at an earlier age if you have risk factors for colon cancer.  Your health care provider may also recommend using home test kits to check for hidden blood in the stool.  A small camera at the end of a tube can be used to examine your colon directly (sigmoidoscopy or colonoscopy). This is done to check for the earliest forms of colorectal cancer.  Routine screening usually begins at age 54.  Direct examination of the colon should be repeated every 5-10 years through 27 years of age. However, you may need to be screened more often if early forms of precancerous polyps or small growths are found.  Skin Cancer  Check your skin from head to toe regularly.  Tell your health care provider about any new moles or changes in moles, especially if there is a change in a mole's shape or color.  Also tell your health  care provider if you have a mole that is larger than the size of a pencil eraser.  Always use sunscreen. Apply sunscreen liberally and repeatedly throughout the day.  Protect yourself by wearing long sleeves, pants, a wide-brimmed hat, and sunglasses whenever you are outside.  Heart disease, diabetes, and high blood pressure  High blood pressure causes heart disease and increases the risk of stroke. High blood pressure is more likely to develop in: ? People who have blood pressure in the high end of the normal range (130-139/85-89 mm Hg). ? People who are overweight or obese. ? People who are African American.  If you are 87-54 years of age, have your blood pressure checked every 3-5 years. If you are 24 years of age or older, have your blood pressure checked every year. You should have your blood pressure measured twice-once when you are at a hospital or clinic, and once when you are not at a hospital or clinic. Record the average of the two measurements.  To check your blood pressure when you are not at a hospital or clinic, you can use: ? An automated blood pressure machine at a pharmacy. ? A home blood pressure monitor.  If you are between 35 years and 23 years old, ask your health care provider if you should take aspirin to prevent strokes.  Have regular diabetes screenings. This involves taking a blood sample to check your fasting blood sugar level. ? If you are at a normal weight and have a low risk for diabetes, have this test once every three years after 27 years of age. ? If you are overweight and have a high risk for diabetes, consider being tested at a younger age or more often. Preventing infection Hepatitis B  If you have a higher risk for hepatitis B, you should be screened for this virus. You are considered at high risk for hepatitis B if: ? You were born in a country where hepatitis B is common. Ask your health care provider which countries are considered high risk. ? Your parents were born in a high-risk country, and you have not been immunized against hepatitis B (hepatitis B vaccine). ? You have HIV or AIDS. ? You use needles to inject street drugs. ? You live with someone who has hepatitis B. ? You have had sex with someone who has hepatitis B. ? You get hemodialysis treatment. ? You take certain medicines for conditions, including cancer, organ transplantation, and autoimmune conditions.  Hepatitis C  Blood testing is recommended for: ? Everyone born from [birth year] through 1965. ? Anyone with known risk factors for hepatitis C.  Sexually transmitted infections (STIs)  You should be screened for sexually transmitted infections (STIs) including gonorrhea and chlamydia if: ? You are sexually active and are younger than 27 years of age. ? You are older than 27 years of age and your health care provider tells you that you are at risk for this type of infection. ? Your sexual activity has changed since you were last screened  and you are at an increased risk for chlamydia or gonorrhea. Ask your health care provider if you are at risk.  If you do not have HIV, but are at risk, it may be recommended that you take a prescription medicine daily to prevent HIV infection. This is called pre-exposure prophylaxis (PrEP). You are considered at risk if: ? You are sexually active and do not regularly use condoms or know the HIV status of your partner(s). ? You  take drugs by injection. ? You are sexually active with a partner who has HIV.  Talk with your health care provider about whether you are at high risk of being infected with HIV. If you choose to begin PrEP, you should first be tested for HIV. You should then be tested every 3 months for as long as you are taking PrEP. Pregnancy  If you are premenopausal and you may become pregnant, ask your health care provider about preconception counseling.  If you may become pregnant, take 400 to 800 micrograms (mcg) of folic acid every day.  If you want to prevent pregnancy, talk to your health care provider about birth control (contraception). Osteoporosis and menopause  Osteoporosis is a disease in which the bones lose minerals and strength with aging. This can result in serious bone fractures. Your risk for osteoporosis can be identified using a bone density scan.  If you are 13 years of age or older, or if you are at risk for osteoporosis and fractures, ask your health care provider if you should be screened.  Ask your health care provider whether you should take a calcium or vitamin D supplement to lower your risk for osteoporosis.  Menopause may have certain physical symptoms and risks.  Hormone replacement therapy may reduce some of these symptoms and risks. Talk to your health care provider about whether hormone replacement therapy is right for you. Follow these instructions at home:  Schedule regular health, dental, and eye exams.  Stay current with your  immunizations.  Do not use any tobacco products including cigarettes, chewing tobacco, or electronic cigarettes.  If you are pregnant, do not drink alcohol.  If you are breastfeeding, limit how much and how often you drink alcohol.  Limit alcohol intake to no more than 1 drink per day for nonpregnant women. One drink equals 12 ounces of beer, 5 ounces of wine, or 1 ounces of hard liquor.  Do not use street drugs.  Do not share needles.  Ask your health care provider for help if you need support or information about quitting drugs.  Tell your health care provider if you often feel depressed.  Tell your health care provider if you have ever been abused or do not feel safe at home. This information is not intended to replace advice given to you by your health care provider. Make sure you discuss any questions you have with your health care provider. Document Released: 10/07/2010 Document Revised: 08/30/2015 Document Reviewed: 12/26/2014 Elsevier Interactive Patient Education  [employer].

## 2017-09-09 NOTE — Assessment & Plan Note (Signed)
-  USPSTF grade A and B recommendations reviewed with patient; age-appropriate recommendations, preventive care, screening tests, etc discussed and encouraged; healthy living, sunscreen use encouraged; see AVS for patient education given to patient -Discussed importance of 150 minutes of physical activity weekly, eat 6 servings of fruit/vegetables daily and drink plenty of water and avoid sweet beverages.  - Follow up and care instructions discussed and provided in AVS.  -Reviewed Health Maintenance: up to date  Screening for HIV (human immunodeficiency virus)- HIV antibody; Future Screening for cholesterol level-fasting today- Lipid panel; Future

## 2017-09-09 NOTE — ED Notes (Signed)
Called 3x, no response.  

## 2017-09-09 NOTE — ED Triage Notes (Signed)
Patient complains of abdominal cramping and heavy vaginal bleeding since am. States that she had positive home pregnancy test earlier today and missed her period in May. Alert and oriented, NAD

## 2017-09-10 LAB — HIV ANTIBODY (ROUTINE TESTING W REFLEX): HIV 1&2 Ab, 4th Generation: NONREACTIVE

## 2017-09-14 ENCOUNTER — Ambulatory Visit: Payer: Self-pay | Admitting: Psychology

## 2017-10-21 ENCOUNTER — Emergency Department (HOSPITAL_COMMUNITY)
Admission: EM | Admit: 2017-10-21 | Discharge: 2017-10-21 | Disposition: A | Discharge status: Home / Self Care | Payer: 59 | Attending: Emergency Medicine | Admitting: Emergency Medicine

## 2017-10-21 ENCOUNTER — Encounter (HOSPITAL_COMMUNITY): Payer: Self-pay | Admitting: Emergency Medicine

## 2017-10-21 ENCOUNTER — Ambulatory Visit: Payer: 59 | Admitting: Psychology

## 2017-10-21 DIAGNOSIS — J45909 Unspecified asthma, uncomplicated: Secondary | ICD-10-CM | POA: Insufficient documentation

## 2017-10-21 DIAGNOSIS — Z79899 Other long term (current) drug therapy: Secondary | ICD-10-CM | POA: Insufficient documentation

## 2017-10-21 DIAGNOSIS — M7918 Myalgia, other site: Secondary | ICD-10-CM

## 2017-10-21 DIAGNOSIS — M791 Myalgia, unspecified site: Secondary | ICD-10-CM | POA: Insufficient documentation

## 2017-10-21 DIAGNOSIS — F17210 Nicotine dependence, cigarettes, uncomplicated: Secondary | ICD-10-CM | POA: Insufficient documentation

## 2017-10-21 LAB — COMPREHENSIVE METABOLIC PANEL
ALT: 14 U/L (ref 0–44)
AST: 16 U/L (ref 15–41)
Albumin: 4 g/dL (ref 3.5–5.0)
Alkaline Phosphatase: 45 U/L (ref 38–126)
Anion gap: 9 (ref 5–15)
BUN: 9 mg/dL (ref 6–20)
CO2: 24 mmol/L (ref 22–32)
Calcium: 9.3 mg/dL (ref 8.9–10.3)
Chloride: 107 mmol/L (ref 98–111)
Creatinine, Ser: 0.95 mg/dL (ref 0.44–1.00)
GFR calc Af Amer: >60 mL/min (ref >60)
GFR calc non Af Amer: >60 mL/min (ref >60)
Glucose, Bld: 92 mg/dL (ref 70–99)
Potassium: 3.9 mmol/L (ref 3.5–5.1)
Sodium: 140 mmol/L (ref 135–145)
Total Bilirubin: 0.6 mg/dL (ref 0.3–1.2)
Total Protein: 7.6 g/dL (ref 6.5–8.1)

## 2017-10-21 LAB — I-STAT BETA HCG BLOOD, ED (MC, WL, AP ONLY): I-stat hCG, quantitative: <5 m[IU]/mL (ref <5)

## 2017-10-21 LAB — CBC
HCT: 38.8 % (ref 36.0–46.0)
Hemoglobin: 12.5 g/dL (ref 12.0–15.0)
MCH: 29.5 pg (ref 26.0–34.0)
MCHC: 32.2 g/dL (ref 30.0–36.0)
MCV: 91.5 fL (ref 78.0–100.0)
Platelets: 324 10*3/uL (ref 150–400)
RBC: 4.24 MIL/uL (ref 3.87–5.11)
RDW: 14.3 % (ref 11.5–15.5)
WBC: 6.2 10*3/uL (ref 4.0–10.5)

## 2017-10-21 LAB — LIPASE, BLOOD: Lipase: 26 U/L (ref 11–51)

## 2017-10-21 MED ORDER — IBUPROFEN 200 MG PO TABS
600.0000 mg | ORAL_TABLET | Freq: Once | ORAL | Status: AC
  Administered 2017-10-21: 600 mg via ORAL
  Filled 2017-10-21: qty 3

## 2017-10-21 MED ORDER — CYCLOBENZAPRINE HCL 10 MG PO TABS
10.0000 mg | ORAL_TABLET | Freq: Once | ORAL | Status: AC
  Administered 2017-10-21: 10 mg via ORAL
  Filled 2017-10-21: qty 1

## 2017-10-21 MED ORDER — IBUPROFEN 600 MG PO TABS: 600.0000 mg | ORAL_TABLET | Freq: Four times a day (QID) | ORAL | 0 refills | Status: DC | PRN

## 2017-10-21 MED ORDER — HYDROCODONE-ACETAMINOPHEN 5-325 MG PO TABS
1.0000 | ORAL_TABLET | Freq: Once | ORAL | Status: AC
  Administered 2017-10-21: 1 via ORAL
  Filled 2017-10-21: qty 1

## 2017-10-21 MED ORDER — CYCLOBENZAPRINE HCL 10 MG PO TABS: 10.0000 mg | ORAL_TABLET | Freq: Two times a day (BID) | ORAL | 0 refills | Status: DC | PRN

## 2017-10-21 MED ORDER — HYDROCODONE-ACETAMINOPHEN 5-325 MG PO TABS: 1.0000 | ORAL_TABLET | ORAL | 0 refills | Status: DC | PRN
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
## 2017-10-26 ENCOUNTER — Ambulatory Visit (INDEPENDENT_AMBULATORY_CARE_PROVIDER_SITE_OTHER): Payer: 59 | Admitting: Psychology

## 2017-10-26 DIAGNOSIS — F331 Major depressive disorder, recurrent, moderate: Secondary | ICD-10-CM | POA: Diagnosis not present

## 2017-10-27 ENCOUNTER — Other Ambulatory Visit: Payer: Self-pay

## 2017-10-27 ENCOUNTER — Emergency Department (HOSPITAL_COMMUNITY): Payer: 59

## 2017-10-27 ENCOUNTER — Ambulatory Visit: Payer: Self-pay | Admitting: Nurse Practitioner

## 2017-10-27 ENCOUNTER — Emergency Department (HOSPITAL_COMMUNITY)
Admission: EM | Admit: 2017-10-27 | Discharge: 2017-10-28 | Disposition: A | Discharge status: Home / Self Care | Payer: 59 | Attending: Emergency Medicine | Admitting: Emergency Medicine

## 2017-10-27 DIAGNOSIS — Z79899 Other long term (current) drug therapy: Secondary | ICD-10-CM | POA: Diagnosis not present

## 2017-10-27 DIAGNOSIS — F17290 Nicotine dependence, other tobacco product, uncomplicated: Secondary | ICD-10-CM | POA: Insufficient documentation

## 2017-10-27 DIAGNOSIS — S0990XA Unspecified injury of head, initial encounter: Secondary | ICD-10-CM | POA: Insufficient documentation

## 2017-10-27 DIAGNOSIS — Y998 Other external cause status: Secondary | ICD-10-CM | POA: Insufficient documentation

## 2017-10-27 DIAGNOSIS — Y9389 Activity, other specified: Secondary | ICD-10-CM | POA: Insufficient documentation

## 2017-10-27 DIAGNOSIS — R51 Headache: Secondary | ICD-10-CM | POA: Insufficient documentation

## 2017-10-27 DIAGNOSIS — J45909 Unspecified asthma, uncomplicated: Secondary | ICD-10-CM | POA: Diagnosis not present

## 2017-10-27 DIAGNOSIS — Y929 Unspecified place or not applicable: Secondary | ICD-10-CM | POA: Diagnosis not present

## 2017-10-27 DIAGNOSIS — F17210 Nicotine dependence, cigarettes, uncomplicated: Secondary | ICD-10-CM | POA: Diagnosis not present

## 2017-10-27 DIAGNOSIS — R200 Anesthesia of skin: Secondary | ICD-10-CM | POA: Insufficient documentation

## 2017-10-27 DIAGNOSIS — S161XXA Strain of muscle, fascia and tendon at neck level, initial encounter: Secondary | ICD-10-CM | POA: Diagnosis not present
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
## 2017-10-28 NOTE — Discharge Instructions (Addendum)
Ibuprofen 600 mill grams every 6 hours as needed for pain.  Follow up with your primary doctor if symptoms are not improving in the next week.

## 2017-10-28 NOTE — Telephone Encounter (Signed)
Just FYI.

## 2017-11-02 ENCOUNTER — Ambulatory Visit: Payer: Self-pay | Admitting: Psychology

## 2017-11-09 ENCOUNTER — Ambulatory Visit (INDEPENDENT_AMBULATORY_CARE_PROVIDER_SITE_OTHER): Payer: Managed Care, Other (non HMO) | Admitting: Psychology

## 2017-11-09 DIAGNOSIS — F331 Major depressive disorder, recurrent, moderate: Secondary | ICD-10-CM | POA: Diagnosis not present

## 2017-11-10 ENCOUNTER — Encounter: Payer: Managed Care, Other (non HMO) | Attending: Nurse Practitioner | Admitting: Dietician

## 2017-11-10 ENCOUNTER — Encounter: Payer: Self-pay | Admitting: Dietician

## 2017-11-10 DIAGNOSIS — E669 Obesity, unspecified: Secondary | ICD-10-CM | POA: Diagnosis not present

## 2017-11-10 DIAGNOSIS — Z6838 Body mass index (BMI) 38.0-38.9, adult: Secondary | ICD-10-CM

## 2017-11-10 DIAGNOSIS — Z6837 Body mass index (BMI) 37.0-37.9, adult: Secondary | ICD-10-CM | POA: Diagnosis not present

## 2017-11-10 DIAGNOSIS — Z713 Dietary counseling and surveillance: Secondary | ICD-10-CM | POA: Insufficient documentation

## 2017-11-10 DIAGNOSIS — R7303 Prediabetes: Secondary | ICD-10-CM

## 2017-11-10 DIAGNOSIS — E6609 Other obesity due to excess calories: Secondary | ICD-10-CM

## 2017-11-10 NOTE — Patient Instructions (Addendum)
Vitamin D 1000-2000 units daily Begin to exercise most days when you are ready. 3 meals per day or 3 mini meals and decent dinner. A meal should include 3 components (1 is a protein).  Tilapia, sweet potato, vegetables If you are unable to eat try a yogurt or protein shake but make sure you are eating.  This is part of self care.

## 2017-11-10 NOTE — Progress Notes (Signed)
  Medical Nutrition Therapy:  Appt start time: 1410 end time:  1510.   Assessment:  Primary concerns today: Patient is here today alone with a referral for obesity.  She states that Her A1C -5.8% 09/09/17 was elevated (prediabetes) and desire to lose weight. She has a lot of DM and heart disease in the family.  She has a history of asthma and has current significant depression with a depression score of 19.  She is seeing a Veterinary surgeoncounselor.  Weight: 298 lbs in January 2019.  Highest weight. 255 lbs 1 week ago 249 lbs today 180 lbs lowest adult weight  Patient states that she gained with school, working 2 jobs, love.  Now with increased depression and very poor appetite.  Skips meals often on her days off.  Weight loss of 49 lbs in the past 6 months.  She also has some financial issues and does not have money to eat at times.  Patient lives alone.  [place].  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  She is going to start nursing school or PA school next fall.    Preferred Learning Style:   No preference indicated   Learning Readiness:   Ready  Change in progress  MEDICATIONS: see list to include Omega-3   DIETARY INTAKE: She has not eaten in the past 24 hours.  Very poor appetite and has to force self to eat when she is out with friends.  Poor appetite for several months. 24-hr recall:  B (from 7 AM-3): banana, yogurt, granola, 2 boiled eggs OR Belvita Snk ( AM):  L ( PM):  Snk ( PM): nuts D ( PM): Smart Choice Frozen dinner Snk ( PM): none Beverages: water, occasional sugar free orange aide, sweet tea (decaf)  Usual physical activity: Working out at [employer] where she lives 3-4 times per week but not recently due to increased depression.  Progress Towards Goal(s):  In progress. Nutritional Diagnosis:  NI-5.5 Imbalance of nutrients As related to depression.  As evidenced by diet hx- 24 hours without food.     Intervention:  Nutrition counseling/education related to healthy nutrition.  Discussed what a meal should look like.  Discussed importance of nutrition for her body and mind (can help with depression).  Discussed insulin resistance and role with Prediabetes.  Discussed about weight loss and need for slow weight loss with healthy eating rather than restrictive eating.  Discussed simple meal planning and starting with cooking 1 meal per week.    Vitamin D 1000-2000 units daily Begin to exercise most days when you are ready. 3 meals per day or 3 mini meals and decent dinner. A meal should include 3 components (1 is a protein).  Tilapia, sweet potato, vegetables If you are unable to eat try a yogurt or protein shake but make sure you are eating.  This is part of self care.  Teaching Method Utilized:  Visual Auditory  Handouts given during visit include:  Meal plan card  A1C sheet  Barriers to learning/adherence to lifestyle change: depression, fiances  Demonstrated degree of understanding via:  Teach Back   Monitoring/Evaluation:  Dietary intake, exercise, and body weight in 1 month(s).

## 2017-11-18 ENCOUNTER — Ambulatory Visit: Payer: Self-pay | Admitting: Psychology

## 2017-11-23 ENCOUNTER — Ambulatory Visit (INDEPENDENT_AMBULATORY_CARE_PROVIDER_SITE_OTHER): Payer: Managed Care, Other (non HMO) | Admitting: Psychology

## 2017-11-23 DIAGNOSIS — F331 Major depressive disorder, recurrent, moderate: Secondary | ICD-10-CM | POA: Diagnosis not present

## 2017-11-30 ENCOUNTER — Ambulatory Visit: Payer: Self-pay | Admitting: Psychology

## 2017-12-04 ENCOUNTER — Ambulatory Visit (INDEPENDENT_AMBULATORY_CARE_PROVIDER_SITE_OTHER): Payer: Managed Care, Other (non HMO) | Admitting: Neurology

## 2017-12-04 DIAGNOSIS — G43719 Chronic migraine without aura, intractable, without status migrainosus: Secondary | ICD-10-CM | POA: Diagnosis not present

## 2017-12-04 MED ORDER — ONABOTULINUMTOXINA 100 UNITS IJ SOLR
155.0000 [IU] | Freq: Once | INTRAMUSCULAR | Status: AC
  Administered 2017-12-04: 155 [IU] via INTRAMUSCULAR

## 2017-12-04 NOTE — Progress Notes (Signed)
[facility]   Procedure Note Botox  Attending: Dr. Adam Jaffe  Preoperative Diagnosis(es): Chronic migraine  Consent obtained from: The patient Benefits discussed included, but were not limited to decreased muscle tightness, increased joint range of motion, and decreased pain.  Risk discussed included, but were not limited pain and discomfort, bleeding, bruising, excessive weakness, venous thrombosis, muscle atrophy and dysphagia.  Anticipated outcomes of the procedure as well as he risks and benefits of the alternatives to the procedure, and the roles and tasks of the personnel to be involved, were discussed with the patient, and the patient consents to the procedure and agrees to proceed. A copy of the patient medication guide was given to the patient which explains the blackbox warning.  Patients identity and treatment sites confirmed Yes.  .  Details of Procedure: Skin was cleaned with alcohol. Prior to injection, the needle plunger was aspirated to make sure the needle was not within a blood vessel.  There was no blood retrieved on aspiration.    Following is a summary of the muscles injected  And the amount of Botulinum toxin used:  Dilution 200 units of Botox was reconstituted with 4 ml of preservative free normal saline. Time of reconstitution: At the time of the office visit (<30 minutes prior to injection)   Injections  155 total units of Botox was injected with a 30 gauge needle.  Injection Sites: L occipitalis: 15 units- 3 sites  R occiptalis: 15 units- 3 sites  L upper trapezius: 15 units- 3 sites R upper trapezius: 15 units- 3 sits          L paraspinal: 10 units- 2 sites R paraspinal: 10 units- 2 sites  Face L frontalis(2 injection sites):10 units   R frontalis(2 injection sites):10 units         L corrugator: 5 units   R corrugator: 5 units           Procerus: 5 units   L temporalis: 20 units R temporalis: 20 units   Agent:  200 units of botulinum Type  A (Onobotulinum Toxin type A) was reconstituted with 4 ml of preservative free normal saline.  Time of reconstitution: At the time of the office visit (<30 minutes prior to injection)     Total injected (Units): 155  Total wasted (Units): 11.25  Patient tolerated procedure well without complications.   Reinjection is anticipated in 3 months. Return to clinic in 6 weeks.   

## 2017-12-15 ENCOUNTER — Encounter: Payer: Managed Care, Other (non HMO) | Attending: Nurse Practitioner | Admitting: Dietician

## 2017-12-15 ENCOUNTER — Encounter: Payer: Self-pay | Admitting: Dietician

## 2017-12-15 DIAGNOSIS — Z713 Dietary counseling and surveillance: Secondary | ICD-10-CM | POA: Diagnosis present

## 2017-12-15 DIAGNOSIS — Z6838 Body mass index (BMI) 38.0-38.9, adult: Secondary | ICD-10-CM

## 2017-12-15 DIAGNOSIS — Z6837 Body mass index (BMI) 37.0-37.9, adult: Secondary | ICD-10-CM | POA: Insufficient documentation

## 2017-12-15 DIAGNOSIS — E669 Obesity, unspecified: Secondary | ICD-10-CM | POA: Insufficient documentation

## 2017-12-15 DIAGNOSIS — E6609 Other obesity due to excess calories: Secondary | ICD-10-CM

## 2017-12-15 NOTE — Patient Instructions (Addendum)
Continue Counseling Consider asking your MD about Sleep Apnea and your risks. Consider trying Cream of Buckwheat Continue 3 meals per day with 3 choices per meal  Small snack with protein if hungry. Mindfulness:  Eat slowly.  Stop when you are satisfied (check half way through). Continue meal planning.  Great job!

## 2017-12-15 NOTE — Progress Notes (Signed)
  Medical Nutrition Therapy:  Appt start time: 1400 end time:  1430.   Assessment:  Primary concerns today: Patient is here today alone for follow up for obesity.  Since last visit her depression score has decreased from 19 to 9.  She has resumed seeing her counselor.  She is living with her boyfriend now and she is cooking more at home and has begun meal planning last week.  She has gained to 253 lbs from 249 lbs but has normalized her eating and caring better for herself.  She reports improved energy.  She is now taking vitamin D and fish oil.   Weight: 298 lbs in January 2019.  Highest weight. 255 lbs 1 week ago 249 lbs today 180 lbs lowest adult weight  Patient states that she gained with school, working 2 jobs, love.  Now with increased depression and very poor appetite.  Skips meals often on her days off.  Weight loss of 49 lbs in the past 6 months.  She also has some financial issues and does not have money to eat at times.  She states that since eating more at home she has more money and is happy with the change.  Patient lives with her boyfriend.  Her family is in [place].  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    Preferred Learning Style:   No preference indicated   Learning Readiness:   Ready  Change in progress   MEDICATIONS: see list   DIETARY INTAKE:  24-hr recall:  B ( AM): naked smoothie, banana, almonds  Snk ( AM): boiled egg and almonds although not hungry  L ( PM): heavier meal leftovers from lunch Snk ( PM): occasional D ( PM): lighter meal with meat, starch, vegetable Snk ( PM): none Beverages: water, occasional sugar free drink, sweet tea decaf  Usual physical activity: working out at [employer] where she lives 3-4 times per week in the past.  This stopped due to depression  Progress Towards Goal(s):  In progress.   Nutritional  Diagnosis:  NB-1.1 Food and nutrition-related knowledge deficit As related to balance of protein, carbohydrate, fat.  As evidenced by diet hx.    Intervention:  Nutrition counseling/education continued.  Encouraged the changes that she has made.  Now that her eating is normalized, working with patient more on mindful eating. Continue Counseling Consider asking your MD about Sleep Apnea and your risks. Consider trying Cream of Buckwheat Continue 3 meals per day with 3 choices per meal  Small snack with protein if hungry. Mindfulness:  Eat slowly.  Stop when you are satisfied (check half way through). Continue meal planning.  Great job!  Teaching Method Utilized:  Auditory  Barriers to learning/adherence to lifestyle change: depression, time  Demonstrated degree of understanding via:  Teach Back   Monitoring/Evaluation:  Dietary intake, exercise, and body weight in 2 month(s).

## 2017-12-31 ENCOUNTER — Ambulatory Visit: Payer: Self-pay | Admitting: *Deleted

## 2017-12-31 NOTE — Telephone Encounter (Signed)
Just a FYI

## 2017-12-31 NOTE — Telephone Encounter (Signed)
Pt called with complaints of severe abdominal pain and nausea for 3 days; she says that it "feels like trapped gas"; she also says that it started after she was having intercourse; her last BM was 12/28/17 and she reports normally having 2-3 times daily; she does report starting 12/30/17 tenderness R>L; she says that the tenderness is better today; the pt says that the pain moves from one side to the other; recommendations made per nurse triage protocol; pt states that she will go to urgent care; will route to office for notification of this encounter.   Reason for Disposition . [1] MILD-MODERATE pain AND [2] constant AND [3] present > 2 hours  Answer Assessment - Initial Assessment Questions 1. LOCATION: "Where does it hurt?"      Lower abdomen 2. RADIATION: "Does the pain shoot anywhere else?" (e.g., chest, back)     Sometimes radiate to lower back 3. ONSET: "When did the pain begin?" (e.g., minutes, hours or days ago)      3 days ago 4. SUDDEN: "Gradual or sudden onset?"     gradual 5. PATTERN "Does the pain come and go, or is it constant?"    - If constant: "Is it getting better, staying the same, or worsening?"      (Note: Constant means the pain never goes away completely; most serious pain is constant and it progresses)     - If intermittent: "How long does it last?" "Do you have pain now?"     (Note: Intermittent means the pain goes away completely between bouts)     Constant last night; this morning comes and goes especially when she feels like she has to pass gas 6. SEVERITY: "How bad is the pain?"  (e.g., Scale 1-10; mild, moderate, or severe)   - MILD (1-3): doesn't interfere with normal activities, abdomen soft and not tender to touch    - MODERATE (4-7): interferes with normal activities or awakens from sleep, tender to touch    - SEVERE (8-10): excruciating pain, doubled over, unable to do any normal activities      Moderate; was tender to touch last night 7. RECURRENT SYMPTOM:  "Have you ever had this type of abdominal pain before?" If so, ask: "When was the last time?" and "What happened that time?"      no 8. CAUSE: "What do you think is causing the abdominal pain?"     gas 9. RELIEVING/AGGRAVATING FACTORS: "What makes it better or worse?" (e.g., movement, antacids, bowel movement)     Gas-x lessens it; movement makes it worse (needs help standing and turning on her side) 10. OTHER SYMPTOMS: "Has there been any vomiting, diarrhea, constipation, or urine problems?"       Rectal, nausea 11. PREGNANCY: "Is there any chance you are pregnant?" "When was your last menstrual period?"      LMP 12/17/17  Protocols used: ABDOMINAL PAIN - [facility] [place]

## 2018-01-11 ENCOUNTER — Ambulatory Visit: Payer: Self-pay | Admitting: Neurology

## 2018-01-20 MED FILL — TINIDAZOLE 500 MG TABLET: 500 | 5 days supply | Qty: 10 | Fill #0

## 2018-01-21 MED FILL — IBUPROFEN 800 MG TAB: 800 | 10 days supply | Qty: 30 | Fill #0

## 2018-02-16 ENCOUNTER — Ambulatory Visit: Payer: Self-pay | Admitting: Dietician

## 2018-02-16 NOTE — Progress Notes (Signed)
NEUROLOGY FOLLOW UP OFFICE NOTE  Kayla Owens [id number]  HISTORY OF PRESENT ILLNESS: Kayla Owens is a 27 year old female with asthma and depression who follows up for migraines.  UPDATE: Intensity:  5/10 Duration:  1 hour with ibuprofen Frequency:  4 to 5 days in past 30 days Frequency of abortive medication: 4 to 5 days a month Rescue protocol:  Takes an ibuprofen usually. Current NSAIDS: ibuprofen Current analgesics: None Current triptans: Zomig 5 mg NS (it worked within 15 minutes) Current ergotamine: None Current anti-emetic: None Current muscle relaxants: None Current anti-anxiolytic: None Current sleep aide: None Current Antihypertensive medications: None Current Antidepressant medications: Citalopram 20 mg Current Anticonvulsant medications: None Current anti-CGRP: None Current Vitamins/Herbal/Supplements: Multivitamin Current Antihistamines/Decongestants: None Other therapy: Botox Hormone/birth control: None  Over last weekend, she had two dizzy spells.  She saw glaring blind spots and then a "woozy" feeling associated with drained feeling.  Symptoms lasted 30 minutes.  She had a headache afterwards and then she laid down to go to sleep.  Caffeine: No Diet: Hydrates, no soda Exercise: Yes Depression: Yes but controlled; Anxiety: Yes but controlled Other pain: No Sleep hygiene: Varies  HISTORY: Onset: 27 years old Location:  Frontal/bi-temporal,sometimes occipital as well Quality:  Pounding/pressure Initial intensity:  10/10 Aura:  Sometimes preceded by blindspots in her vision Prodrome:  no Postdrome:  no Associated symptoms:  Nausea, vomiting, photophobia, phonophobia, osmophobia.  She has not had any new worse headache of her life, waking up from sleep Initial duration:  At least 2 days Initial Frequency:  16 headache days per month for several years Triggers: None Relieving factors:  Rubbing temples, laying in dark quiet cool room Activity:   Cannot function 4 days per month.  Past NSAIDS:  Aleve, diclofenac, Mobic, toradol Past analgesics:  acetaminophen, Goody, BC, Excedrin Past abortive triptans:  Sumatriptan tablet, Maxalt Past muscle relaxants:  tizanidine, cyclobenzaprine Past anti-emetic:  promethazine Past antihypertensive medications:  no Past antidepressant medications:  Amitriptyline, nortriptyline Past anticonvulsant medications:  topiramate Past vitamins/Herbal/Supplements:  no Other past therapies:  no  Family history of headache/migraine:  mother, maternal grandmother  Reportedly had MRI of brain in 2009 which was negative.  PAST MEDICAL HISTORY: Past Medical History:  Diagnosis Date  . Anxiety   . Asthma   . Chicken pox   . Depression   . Hx of migraines     MEDICATIONS: Current Outpatient Medications on File Prior to Visit  Medication Sig Dispense Refill  . albuterol (PROVENTIL HFA;VENTOLIN HFA) 108 (90 Base) MCG/ACT inhaler Inhale 1-2 puffs into the lungs every 6 (six) hours as needed for wheezing or shortness of breath. 1 Inhaler 3  . beclomethasone (QVAR) 40 MCG/ACT inhaler Inhale 2 puffs into the lungs daily. 1 Inhaler 3  . citalopram (CELEXA) 20 MG tablet Take 1.5 tablets (30 mg total) by mouth daily. 45 tablet 3  . cyclobenzaprine (FLEXERIL) 10 MG tablet Take 1 tablet (10 mg total) by mouth 2 (two) times daily as needed for muscle spasms. (Patient not taking: Reported on 11/10/2017) 20 tablet 0  . HYDROcodone-acetaminophen (NORCO/VICODIN) 5-325 MG tablet Take 1 tablet by mouth every 4 (four) hours as needed. (Patient not taking: Reported on 10/27/2017) 6 tablet 0  . ibuprofen (ADVIL,MOTRIN) 600 MG tablet Take 1 tablet (600 mg total) by mouth every 6 (six) hours as needed. 30 tablet 0  . montelukast (SINGULAIR) 10 MG tablet Take 1 tablet (10 mg total) by mouth at bedtime. 90 tablet 1  . Omega-3  Fatty Acids (FISH OIL) 1000 MG CAPS Take by mouth.    . OnabotulinumtoxinA (BOTOX IJ) Inject 1 each as  directed every 3 (three) months.    . terconazole (TERAZOL 7) 0.4 % vaginal cream Place 1 applicator vaginally at bedtime. (Patient not taking: Reported on 10/21/2017) 45 g 0  . zolmitriptan (ZOMIG) 5 MG nasal solution Place 1 spray into the nose as needed for migraine. 6 Units 0  . zonisamide (ZONEGRAN) 25 MG capsule Take 25 mg by mouth daily.      No current facility-administered medications on file prior to visit.     ALLERGIES: No Known Allergies  FAMILY HISTORY: Family History  Problem Relation Age of Onset  . Arthritis Mother   . Hypertension Mother   . Lupus Mother   . Heart disease Maternal Grandmother   . Heart disease Maternal Grandfather   . Arthritis Paternal Grandmother   . Stomach cancer Paternal Grandfather    SOCIAL HISTORY: Social History   Socioeconomic History  . Marital status: Single    Spouse name: Not on file  . Number of children: 0  . Years of education: 30  . Highest education level: Not on file  Occupational History  . Occupation: Clinical cytogeneticist: [employer]  Social Needs  . Financial resource strain: Not on file  . Food insecurity:    Worry: Not on file    Inability: Not on file  . Transportation needs:    Medical: Not on file    Non-medical: Not on file  Tobacco Use  . Smoking status: Current Some Day Smoker    Types: Cigarettes  . Smokeless tobacco: Never Used  . Tobacco comment: smokes black and mild cigars QOD  Substance and Sexual Activity  . Alcohol use: No  . Drug use: No  . Sexual activity: Yes  Lifestyle  . Physical activity:    Days per week: Not on file    Minutes per session: Not on file  . Stress: Not on file  Relationships  . Social connections:    Talks on phone: Not on file    Gets together: Not on file    Attends religious service: Not on file    Active member of club or organization: Not on file    Attends meetings of clubs or organizations: Not on file    Relationship status: Not on file  . Intimate partner  violence:    Fear of current or ex partner: Not on file    Emotionally abused: Not on file    Physically abused: Not on file    Forced sexual activity: Not on file  Other Topics Concern  . Not on file  Social History Narrative   Fun/Hobby: Swimming / pool   Single, lives with boyfriend, avoids caffeine. Exercises 2x a week    REVIEW OF SYSTEMS: Constitutional: No fevers, chills, or sweats, no generalized fatigue, change in appetite Eyes: No visual changes, double vision, eye pain Ear, nose and throat: No hearing loss, ear pain, nasal congestion, sore throat Cardiovascular: No chest pain, palpitations Respiratory:  No shortness of breath at rest or with exertion, wheezes GastrointestinaI: No nausea, vomiting, diarrhea, abdominal pain, fecal incontinence Genitourinary:  No dysuria, urinary retention or frequency Musculoskeletal:  No neck pain, back pain Integumentary: No rash, pruritus, skin lesions Neurological: as above Psychiatric: No depression, insomnia, anxiety Endocrine: No palpitations, fatigue, diaphoresis, mood swings, change in appetite, change in weight, increased thirst Hematologic/Lymphatic:  No purpura, petechiae. Allergic/Immunologic:  no itchy/runny eyes, nasal congestion, recent allergic reactions, rashes  PHYSICAL EXAM: Blood pressure 90/70, pulse 88, height 5' 7.5" (1.715 m), weight 255 lb (115.7 kg). General: No acute distress.  Patient appears well-groomed.   Head:  Normocephalic/atraumatic Eyes:  Fundi examined but not visualized Neck: supple, no paraspinal tenderness, full range of motion Heart:  Regular rate and rhythm Lungs:  Clear to auscultation bilaterally Back: No paraspinal tenderness Neurological Exam: alert and oriented to person, place, and time. Attention span and concentration intact, recent and remote memory intact, fund of knowledge intact.  Speech fluent and not dysarthric, language intact.  CN II-XII intact. Bulk and tone normal, muscle  strength 5/5 throughout.  Sensation to light touch, temperature and vibration intact.  Deep tendon reflexes 2+ throughout, toes downgoing.  Finger to nose and heel to shin testing intact.  Gait normal, Romberg negative.  IMPRESSION: Migraine without aura (sometimes with aura), status migrainosus, not intractable.  The 2 dizzy spells over the weekend are likely migraine related as well.  PLAN: 1.  She will follow-up for her next Botox in December. 2.  She will continue ibuprofen as needed (Zomig nasal spray for severe migraines) 3.  Limit use of pain relievers to no more than 2 days out of the week to prevent rebound headache. 4.  Keep headache diary.  Shon MilletAdam Jerrion Tabbert, DO  CC: Alphonse GuildAshleigh Shambley, NP

## 2018-02-17 ENCOUNTER — Ambulatory Visit (INDEPENDENT_AMBULATORY_CARE_PROVIDER_SITE_OTHER): Payer: Managed Care, Other (non HMO) | Admitting: Neurology

## 2018-02-17 ENCOUNTER — Encounter: Payer: Self-pay | Admitting: Neurology

## 2018-02-17 VITALS — BP 90/70 | HR 88 | Ht 67.5 in | Wt 255.0 lb

## 2018-02-17 DIAGNOSIS — G43009 Migraine without aura, not intractable, without status migrainosus: Secondary | ICD-10-CM

## 2018-02-17 MED ORDER — ZOLMITRIPTAN 5 MG NA SOLN: NASAL | 0 refills | Status: DC

## 2018-02-17 MED ORDER — ZOLMITRIPTAN 5 MG NA SOLN: 5.0000 mg | NASAL | 0 refills | Status: DC | PRN

## 2018-02-17 NOTE — Patient Instructions (Addendum)
1.  Follow up for next Botox. 2.  Will prescribe you Zomig NS to have on-hand.  We will give you another sample in the meantime. 3.  Continue ibuprofen for mild to moderate headaches.  Limit use of pain relievers to no more than 2 days out of week to prevent risk of rebound or medication-overuse headache. 4.  Keep headache diary

## 2018-02-19 ENCOUNTER — Telehealth: Payer: Self-pay

## 2018-02-19 NOTE — Telephone Encounter (Signed)
Received notice via [email or web address] that pt's ZOMIG 5mg  nasal spray has been approved  02/19/18 thru 02/20/2019

## 2018-02-19 NOTE — Telephone Encounter (Signed)
[email or web address]  PA Case ID: [id number]

## 2018-03-12 ENCOUNTER — Ambulatory Visit (INDEPENDENT_AMBULATORY_CARE_PROVIDER_SITE_OTHER): Payer: Managed Care, Other (non HMO) | Admitting: Neurology

## 2018-03-12 DIAGNOSIS — G43719 Chronic migraine without aura, intractable, without status migrainosus: Secondary | ICD-10-CM

## 2018-03-12 MED ORDER — ONABOTULINUMTOXINA 100 UNITS IJ SOLR
155.0000 [IU] | Freq: Once | INTRAMUSCULAR | Status: AC
  Administered 2018-03-12: 155 [IU] via INTRAMUSCULAR

## 2018-03-12 NOTE — Progress Notes (Signed)
[facility]   Procedure Note Botox  Attending: Dr. Jaffe  Preoperative Diagnosis(es): Chronic migraine  Consent obtained from: The patient. Benefits discussed included, but were not limited to decreased muscle tightness, increased joint range of motion, and decreased pain.  Risk discussed included, but were not limited pain and discomfort, bleeding, bruising, excessive weakness, venous thrombosis, muscle atrophy and dysphagia.  Anticipated outcomes of the procedure as well as he risks and benefits of the alternatives to the procedure, and the roles and tasks of the personnel to be involved, were discussed with the patient, and the patient consents to the procedure and agrees to proceed. A copy of the patient medication guide was given to the patient which explains the blackbox warning.  Patients identity and treatment sites confirmed:  Yes.  Details of Procedure: Skin was cleaned with alcohol. Prior to injection, the needle plunger was aspirated to make sure the needle was not within a blood vessel.  There was no blood retrieved on aspiration.    Following is a summary of the muscles injected  And the amount of Botulinum toxin used:  Dilution 200 units of Botox was reconstituted with 4 ml of preservative free normal saline. Time of reconstitution: At the time of the office visit (<30 minutes prior to injection)   Injections  155 total units of Botox was injected with a 30 gauge needle.  Injection Sites: L occipitalis: 15 units- 3 sites  R occiptalis: 15 units- 3 sites  L upper trapezius: 15 units- 3 sites R upper trapezius: 15 units- 3 sits          L paraspinal: 10 units- 2 sites R paraspinal: 10 units- 2 sites  Face L frontalis(2 injection sites):10 units   R frontalis(2 injection sites):10 units         L corrugator: 5 units   R corrugator: 5 units           Procerus: 5 units   L temporalis: 20 units R temporalis: 20 units   Agent:  200 units of botulinum Type A  (Onobotulinum Toxin type A) was reconstituted with 4 ml of preservative free normal saline.  Time of reconstitution: At the time of the office visit (<30 minutes prior to injection)     Total injected (Units): 155  Total wasted (Units): 45  Patient tolerated procedure well without complications.   Reinjection is anticipated in 3 months. Return to clinic in 4.5 months.   

## 2018-04-01 ENCOUNTER — Telehealth: Payer: Self-pay | Admitting: *Deleted

## 2018-04-01 NOTE — Telephone Encounter (Signed)
Attempted to contact the patient, but no answer.

## 2018-04-02 ENCOUNTER — Telehealth: Payer: Self-pay | Admitting: *Deleted

## 2018-04-02 NOTE — Telephone Encounter (Signed)
Patient called back and scheduled an appt for 1/8 at 10:15am

## 2018-04-02 NOTE — Telephone Encounter (Signed)
Called and left the patient a message to call the office back. The patient needs to be scheduled for a new patient appt  

## 2018-04-14 ENCOUNTER — Encounter: Payer: Self-pay | Admitting: Gynecologic Oncology

## 2018-04-14 ENCOUNTER — Inpatient Hospital Stay: Payer: Managed Care, Other (non HMO) | Attending: Gynecologic Oncology | Admitting: Gynecologic Oncology

## 2018-04-14 VITALS — BP 122/75 | HR 83 | Temp 98.0°F | Resp 20 | Ht 67.5 in | Wt 245.8 lb

## 2018-04-14 DIAGNOSIS — N83209 Unspecified ovarian cyst, unspecified side: Secondary | ICD-10-CM

## 2018-04-14 DIAGNOSIS — N979 Female infertility, unspecified: Secondary | ICD-10-CM

## 2018-04-14 DIAGNOSIS — N83201 Unspecified ovarian cyst, right side: Secondary | ICD-10-CM | POA: Diagnosis present

## 2018-04-14 DIAGNOSIS — F17210 Nicotine dependence, cigarettes, uncomplicated: Secondary | ICD-10-CM | POA: Diagnosis not present

## 2018-04-14 DIAGNOSIS — N809 Endometriosis, unspecified: Secondary | ICD-10-CM | POA: Insufficient documentation

## 2018-04-14 MED ORDER — SENNOSIDES-DOCUSATE SODIUM 8.6-50 MG PO TABS
2.0000 | ORAL_TABLET | Freq: Every day | ORAL | 1 refills | Status: DC
Start: 2018-04-14 — End: 2018-06-12

## 2018-04-14 MED ORDER — OXYCODONE HCL 5 MG PO TABS: 5.0000 mg | ORAL_TABLET | ORAL | 0 refills | Status: DC | PRN

## 2018-04-14 MED ORDER — IBUPROFEN 600 MG PO TABS: 600.0000 mg | ORAL_TABLET | Freq: Four times a day (QID) | ORAL | 0 refills | Status: DC | PRN

## 2018-04-14 NOTE — Patient Instructions (Signed)
Preparing for your Surgery  Plan for surgery on May 04, 2018 with Dr. Adolphus BirchwoodEmma [facility]. You will be scheduled for a robotic assisted right ovarian cystectomy, possible oophorectomy.   Pre-operative Testing -You will receive a phone call from presurgical testing at [facility] if you have not received a call already to arrange for a pre-operative testing appointment before your surgery.  This appointment normally occurs one to two weeks before your scheduled surgery.   -Bring your insurance card, copy of an advanced directive if applicable, medication list  -At that visit, you will be asked to sign a consent for a possible blood transfusion in case a transfusion becomes necessary during surgery.  The need for a blood transfusion is rare but having consent is a necessary part of your care.     -You should not be taking blood thinners or aspirin at least ten days prior to surgery unless instructed by your surgeon.  -As part of our enhanced surgical recovery pathway, you may be advised to drink a carbohydrate drink the morning of surgery (at least 3 hours before). If you are diabetic, this will be avoided in order to prevent elevated glucose levels.  Day Before Surgery at Home -You will be asked to take in a light diet the day before surgery.  Avoid carbonated beverages.  You will be advised to have nothing to eat or drink after midnight the evening before.    Eat a light diet the day before surgery.  Examples including soups, broths, toast, yogurt, mashed potatoes.  Things to avoid include carbonated beverages (fizzy beverages), raw fruits and raw vegetables, or beans.   If your bowels are filled with gas, your surgeon will have difficulty visualizing your pelvic organs which increases your surgical risks.  Your role in recovery Your role is to become active as soon as directed by your doctor, while still giving yourself time to heal.  Rest when you feel tired. You  will be asked to do the following in order to speed your recovery:  - Cough and breathe deeply. This helps toclear and expand your lungs and can prevent pneumonia. You may be given a spirometer to practice deep breathing. A staff member will show you how to use the spirometer. - Do mild physical activity. Walking or moving your legs help your circulation and body functions return to normal. A staff member will help you when you try to walk and will provide you with simple exercises. Do not try to get up or walk alone the first time. - Actively manage your pain. Managing your pain lets you move in comfort. We will ask you to rate your pain on a scale of zero to 10. It is your responsibility to tell your doctor or nurse where and how much you hurt so your pain can be treated.  Special Considerations -If you are diabetic, you may be placed on insulin after surgery to have closer control over your blood sugars to promote healing and recovery.  This does not mean that you will be discharged on insulin.  If applicable, your oral antidiabetics will be resumed when you are tolerating a solid diet.  -Your final pathology results from surgery should be available around one week after surgery and the results will be relayed to you when available.  -Dr. Antionette CharLisa Owens is the Surgeon that assists your GYN Oncologist with surgery.  If you end up staying overnight, the next day after your surgery you will  either see your GYN Oncologist, Dr. Tawnya Owens, or Dr. Antionette Owens.  -FMLA forms can be faxed to [phone] and please allow 5-7 business days for completion.  Pain Management After Surgery -You have been prescribed your pain medication and bowel regimen medications before surgery so that you can have these available when you are discharged from the hospital. The pain medication is for use ONLY AFTER surgery and a new prescription will not be given.   -Make sure that you have Tylenol and  Ibuprofen at home to use on a regular basis after surgery for pain control. We recommend alternating the medications every hour to six hours since they work differently and are processed in the body differently for pain relief.  -Review the attached handout on narcotic use and their risks and side effects.   Bowel Regimen -You have been prescribed Sennakot-S to take nightly to prevent constipation especially if you are taking the narcotic pain medication intermittently.  It is important to prevent constipation and drink adequate amounts of liquids.     Blood Transfusion Information WHAT IS A BLOOD TRANSFUSION? A transfusion is the replacement of blood or some of its parts. Blood is made up of multiple cells which provide different functions.  Red blood cells carry oxygen and are used for blood loss replacement.  White blood cells fight against infection.  Platelets control bleeding.  Plasma helps clot blood.  Other blood products are available for specialized needs, such as hemophilia or other clotting disorders. BEFORE THE TRANSFUSION  Who gives blood for transfusions?   You may be able to donate blood to be used at a later date on yourself (autologous donation).  Relatives can be asked to donate blood. This is generally not any safer than if you have received blood from a stranger. The same precautions are taken to ensure safety when a relative's blood is donated.  Healthy volunteers who are fully evaluated to make sure their blood is safe. This is blood bank blood. Transfusion therapy is the safest it has ever been in the practice of medicine. Before blood is taken from a donor, a complete history is taken to make sure that person has no history of diseases nor engages in risky social behavior (examples are intravenous drug use or sexual activity with multiple partners). The donor's travel history is screened to minimize risk of transmitting infections, such as malaria. The donated  blood is tested for signs of infectious diseases, such as HIV and hepatitis. The blood is then tested to be sure it is compatible with you in order to minimize the chance of a transfusion reaction. If you or a relative donates blood, this is often done in anticipation of surgery and is not appropriate for emergency situations. It takes many days to process the donated blood. RISKS AND COMPLICATIONS Although transfusion therapy is very safe and saves many lives, the main dangers of transfusion include:   Getting an infectious disease.  Developing a transfusion reaction. This is an allergic reaction to something in the blood you were given. Every precaution is taken to prevent this. The decision to have a blood transfusion has been considered carefully by your caregiver before blood is given. Blood is not given unless the benefits outweigh the risks.

## 2018-04-14 NOTE — Progress Notes (Signed)
Consult Note: Gyn-Onc  Consult was requested by Dr. Amado NashAlmquist for the evaluation of Kayla Owens 28 y.o. female  CC:  Chief Complaint  Patient presents with  . Cyst of ovary, unspecified laterality    Assessment/Plan:  [name]  is a 28 y.o.  year old G0 with dyspareunia, dysmenorrhea, a 4.5cm right ovarian cyst and infertility. She has a normal CA 125.  I am concerned that her clinical picture is consistent with endometriosis/endometrioma. I discussed with Kayla Owens that I have a low suspicion that this cyst is malignant. However, given her pain and infertility, and the known beneficial impact to fertility of removing endometriomas, I am recommending surgery to remove the right ovarian cyst.  I explained that it may not be possible to separate the cyst from the underlying ovary and a right salpingo-oophorectomy may need to be performed.  We will avoid this for if possible.  I explained that fertility should still be the same regardless of whether or not she has had an oophorectomy.  She does not desire bilateral salpingectomy, despite recommendations from her fertility specialist following the abnormal hysterosalpingogram.  I explained that endometriosis is commonly associated with dense pelvic adhesions and increased risk of visceral injury at the time of surgery.  I explained other operative risks including  bleeding, infection, damage to internal organs (such as bladder,ureters, bowels), blood clot, reoperation and rehospitalization. I explained that the definitive treatment for endometriosis is total hysterectomy BSO, and while ovaries are retained she potentially will have recurrence of this disease.  However given her young age and the fact that she desires future fertility this is not appropriate for her.  Therefore if endometriosis is diagnosed and endometriomas removed, I would recommend that she return to Dr. Amado NashAlmquist and her fertility doctor for discussion about medical  management of endometriosis.  She is scheduled for a robotic assisted right ovarian cystectomy possible oophorectomy in late January 2020.   HPI: [name] is a [age] year old P0 who is seen in consultation at the request of Dr Amado NashAlmquist for a 4.5cm complex right ovarian cyst in the setting of dyspareunia, dysmenorrhea and infertility. She states that her aunts have a history of ovarian cancer.  Patient reports having dysmenorrhea since adolescence but has been gradually getting worse.  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  She was recommended to have bilateral salpingectomy but declined this.  Due to her pain symptoms that she underwent ultrasound imaging of the pelvis in October 2019.  This showed a uterus measuring 7 x 5.4 x 3.1 cm with a right ovary containing a complex cyst with webbed appearance, no internal blood flow noted, possible hemorrhagic components.  It measured 4.4 x 2.8 x 3.9 cm.  The left adnexal contained a complex cystic area measuring 3.3 cm.  Due to the abnormal cystic areas in both ovaries she underwent repeat ultrasound examination on March 24, 2018.  This revealed on the right ovary is stable 4.3 x 2.9 x 2.4 cm right ovarian complex cystic mass with septations and debris and irregular borders.  It was difficult to tell if this was ovarian or paraovarian.  The left ovary contained 2 much smaller lesions.  CA 125 on 03/24/18 was normal at 4.5.   Kayla Owens reports that she had 2 maternal aunts with history of ovarian cancer.   She has never had abdominal surgery. 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  Current Meds:  Outpatient Encounter Medications as of 04/14/2018  Medication Sig  . albuterol (PROVENTIL HFA;VENTOLIN HFA) 108 (90 Base) MCG/ACT inhaler Inhale 1-2 puffs into the lungs every 6 (six) hours as needed for wheezing  or shortness of breath.  . beclomethasone (QVAR) 40 MCG/ACT inhaler Inhale 2 puffs into the lungs daily. (Patient taking differently: Inhale 2 puffs into the lungs as needed. )  . ibuprofen (ADVIL,MOTRIN) 600 MG tablet Take 1 tablet (600 mg total) by mouth every 6 (six) hours as needed for moderate pain. For AFTER surgery  . montelukast (SINGULAIR) 10 MG tablet Take 1 tablet (10 mg total) by mouth at bedtime.  . Omega-3 Fatty Acids (FISH OIL) 1000 MG CAPS Take by mouth.  . OnabotulinumtoxinA (BOTOX IJ) Inject 1 each as directed every 3 (three) months.  . zolmitriptan (ZOMIG) 5 MG nasal solution 1 spray in one nostril at earliest onset of migraine.  May repeat dose x1 in 2h  . [DISCONTINUED] ibuprofen (ADVIL,MOTRIN) 600 MG tablet Take 1 tablet (600 mg total) by mouth every 6 (six) hours as needed.  . [DISCONTINUED] ibuprofen (ADVIL,MOTRIN) 600 MG tablet Take 1 tablet (600 mg total) by mouth every 6 (six) hours as needed for moderate pain. For AFTER surgery  . oxyCODONE (OXY IR/ROXICODONE) 5 MG immediate release tablet Take 1 tablet (5 mg total) by mouth every 4 (four) hours as needed for severe pain. For AFTER surgery only, do not take and drive  . senna-docusate (SENOKOT-S) 8.6-50 MG tablet Take 2 tablets by mouth at bedtime. Do not take if having loose stools. For AFTER surgery  . [DISCONTINUED] citalopram (CELEXA) 20 MG tablet Take 1.5 tablets (30 mg total) by mouth daily. (Patient not taking: Reported on 04/14/2018)  . [DISCONTINUED] cyclobenzaprine (FLEXERIL) 10 MG tablet Take 1 tablet (10 mg total) by mouth 2 (two) times daily as needed for muscle spasms. (Patient not taking: Reported on 11/10/2017)  . [DISCONTINUED] HYDROcodone-acetaminophen (NORCO/VICODIN) 5-325 MG tablet Take 1 tablet by mouth every 4 (four) hours as needed. (Patient not taking: Reported on 10/27/2017)  . [DISCONTINUED] terconazole (TERAZOL 7) 0.4 % vaginal cream Place 1 applicator vaginally at bedtime. (Patient not taking: Reported  on 10/21/2017)  . [DISCONTINUED] zolmitriptan (ZOMIG) 5 MG nasal solution Place 1 spray into the nose as needed for migraine. (Patient not taking: Reported on 04/14/2018)  . [DISCONTINUED] zonisamide (ZONEGRAN) 25 MG capsule Take 25 mg by mouth daily.    No facility-administered encounter medications on file as of 04/14/2018.     Allergy: No Known Allergies  Social Hx:   Social History   Socioeconomic History  . Marital status: Single    Spouse name: Not on file  . Number of children: 0  . Years of education: 2014  . Highest education level: Not on file  Occupational History  . Occupation: Clinical cytogeneticistMA    Employer: [employer]  Social Needs  . Financial resource strain: Not on file  . Food insecurity:    Worry: Not on file    Inability: Not on file  . Transportation needs:    Medical: Not on file    Non-medical: Not on file  Tobacco Use  . Smoking status: Current Every Day Smoker    Types: Cigarettes  . Smokeless tobacco: Never Used  . Tobacco comment: smokes black and mild cigars QOD, patient states two a day  Substance and Sexual Activity  . Alcohol use: No  . Drug use: No  .  Sexual activity: Yes  Lifestyle  . Physical activity:    Days per week: Not on file    Minutes per session: Not on file  . Stress: Not on file  Relationships  . Social connections:    Talks on phone: Not on file    Gets together: Not on file    Attends religious service: Not on file    Active member of club or organization: Not on file    Attends meetings of clubs or organizations: Not on file    Relationship status: Not on file  . Intimate partner violence:    Fear of current or ex partner: Not on file    Emotionally abused: Not on file    Physically abused: Not on file    Forced sexual activity: Not on file  Other Topics Concern  . Not on file  Social History Narrative   Fun/Hobby: Swimming / pool   Single, lives with boyfriend, avoids caffeine. Exercises 2x a week    Past Surgical Hx:  Past  Surgical History:  Procedure Laterality Date  . biopsy of cervix    . WISDOM TOOTH EXTRACTION      Past Medical Hx:  Past Medical History:  Diagnosis Date  . Anxiety   . Asthma   . Chicken pox   . Depression   . Hx of migraines     Past Gynecological History:  No history of abnormal paps since age 42. Infertility (possible tubal factor). No LMP recorded.  Family Hx:  Family History  Problem Relation Age of Onset  . Arthritis Mother   . Hypertension Mother   . Lupus Mother   . Heart disease Maternal Grandmother   . Heart disease Maternal Grandfather   . Hypertension Maternal Grandfather   . Arthritis Paternal Grandmother   . Stomach cancer Paternal Grandfather   . Ovarian cancer Maternal Aunt   . Ovarian cancer Maternal Aunt     Review of Systems:  Constitutional  Feels well,    ENT Normal appearing ears and nares bilaterally Skin/Breast  No rash, sores, jaundice, itching, dryness Cardiovascular  No chest pain, shortness of breath, or edema  Pulmonary  No cough or wheeze.  Gastro Intestinal  No nausea, vomitting, or diarrhoea. No bright red blood per rectum, no abdominal pain, change in bowel movement, or constipation.  Genito Urinary  No frequency, urgency, dysuria, +dysmenorrhea and dyspareunia Musculo Skeletal  No myalgia, arthralgia, joint swelling or pain  Neurologic  No weakness, numbness, change in gait,  Psychology  No depression, anxiety, insomnia.   Vitals:  Blood pressure 122/75, pulse 83, temperature 98 F (36.7 C), temperature source Oral, resp. rate 20, height 5' 7.5" (1.715 m), weight 245 lb 12.8 oz (111.5 kg), SpO2 100 %.  Physical Exam: WD in NAD Neck  Supple NROM, without any enlargements.  Lymph Node Survey No cervical supraclavicular or inguinal adenopathy Cardiovascular  Pulse normal rate, regularity and rhythm. S1 and S2 normal.  Lungs  Clear to auscultation bilateraly, without wheezes/crackles/rhonchi. Good air movement.  Skin   No rash/lesions/breakdown  Psychiatry  Alert and oriented to person, place, and time  Abdomen  Normoactive bowel sounds, abdomen soft, non-tender and overweight vs obese without evidence of hernia. Back No CVA tenderness Genito Urinary  Vulva/vagina: Normal external female genitalia.  No lesions. No discharge or bleeding.  Bladder/urethra:  No lesions or masses, well supported bladder  Vagina: normal  Cervix: Normal appearing, no lesions.  Uterus:  Small, mobile, no parametrial involvement  or nodularity.  Adnexa: no discrete masses appreciated. Rectal  Good tone, no masses no cul de sac nodularity.  Extremities  No bilateral cyanosis, clubbing or edema.   Luisa Dago, MD  04/14/2018, 5:02 PM

## 2018-04-16 ENCOUNTER — Telehealth: Payer: Self-pay | Admitting: *Deleted

## 2018-04-16 NOTE — Telephone Encounter (Signed)
Patient called and requested a release of information to be fax to her. Patient request her records to be fax to other office. The patient will fax the form back to our office.

## 2018-04-16 NOTE — Telephone Encounter (Signed)
Per patient request I have fax records to [facility]

## 2018-04-27 NOTE — Patient Instructions (Signed)
Kayla Owens  23-Sep-1990    Your procedure is scheduled on:  05-04-2018   Report to [facility] Main  Entrance,  Report to admitting at  5:30 AM    Call this number if you have problems the morning of surgery [phone]        Remember: Eat a light diet the day before surgery.  Examples including soups, broths, toast, yogurt, mashed potatoes.  Things to avoid include carbonated beverages (fizzy beverages), raw fruits and raw vegetables, or beans.  If your bowels are filled with gas, your surgeon will have difficulty visualizing your pelvic organs which increases your surgical risks.   NO SOLID FOOD AFTER MIDNIGHT THE NIGHT PRIOR TO SURGERY. NOTHING BY MOUTH EXCEPT CLEAR LIQUIDS UNTIL 3 HOURS PRIOR TO SCHEULED SURGERY. PLEASE FINISH ENSURE DRINK PER SURGEON ORDER 3 HOURS PRIOR TO SCHEDULED SURGERY TIME WHICH NEEDS TO BE COMPLETED AT  4:30 AM.   NOTHING BY MOUTH AFTER 4:30 AM INCLUDING CANDY,GUM,MINTS.   BRUSH YOUR TEETH MORNING OF SURGERY AND RINSE YOUR MOUTH OUT       Take these medicines the morning of surgery with A SIP OF WATER:   Zomig if needed,  Bring QVar and Albuterol inhaler with you day of surgery                                  You may not have any metal on your body including hair pins and               piercings  Do not wear jewelry, make-up, lotions, powders or perfumes, deodorant              Do not wear nail polish.  Do not shave  48 hours prior to surgery.       Do not bring valuables to the hospital. Kayla Owens IS NOT             RESPONSIBLE   FOR VALUABLES.  Contacts, dentures or bridgework may not be worn into surgery.  Leave suitcase in the car. After surgery it may be brought to your room.    _____________________________________________________________________      CLEAR LIQUID DIET   Foods Allowed                                                                     Foods Excluded  Coffee and tea, regular  and decaf                             liquids that you cannot  Plain Jell-O in any flavor                                             see through such as: Fruit ices (not with fruit pulp)  milk, soups, orange juice  Iced Popsicles                                    All solid food Carbonated beverages, regular and diet                                    Cranberry, grape and apple juices Sports drinks like Gatorade Lightly seasoned clear broth or consume(fat free) Sugar, honey syrup  Sample Menu Breakfast                                Lunch                                     Supper Cranberry juice                    Beef broth                            Chicken broth Jell-O                                     Grape juice                           Apple juice Coffee or tea                        Jell-O                                      Popsicle                                                Coffee or tea                        Coffee or tea  _____________________________________________________________________            [facility] - Preparing for Surgery Before surgery, you can play an important role.  Because skin is not sterile, your skin needs to be as free of germs as possible.  You can reduce the number of germs on your skin by washing with CHG (chlorahexidine gluconate) soap before surgery.  CHG is an antiseptic cleaner which kills germs and bonds with the skin to continue killing germs even after washing. Please DO NOT use if you have an allergy to CHG or antibacterial soaps.  If your skin becomes reddened/irritated stop using the CHG and inform your nurse when you arrive at Short Stay. Do not shave (including legs and underarms) for at least 48 hours prior to the first CHG shower.  You may shave your face/neck. Please follow these instructions carefully:  1.  Shower with CHG Soap the night before surgery and the  morning  of Surgery.  2.  If  you choose to wash your hair, wash your hair first as usual with your  normal  shampoo.  3.  After you shampoo, rinse your hair and body thoroughly to remove the  shampoo.                           4.  Use CHG as you would any other liquid soap.  You can apply chg directly  to the skin and wash                       Gently with a scrungie or clean washcloth.  5.  Apply the CHG Soap to your body ONLY FROM THE NECK DOWN.   Do not use on face/ open                           Wound or open sores. Avoid contact with eyes, ears mouth and genitals (private parts).                       Wash face,  Genitals (private parts) with your normal soap.             6.  Wash thoroughly, paying special attention to the area where your surgery  will be performed.  7.  Thoroughly rinse your body with warm water from the neck down.  8.  DO NOT shower/wash with your normal soap after using and rinsing off  the CHG Soap.             9.  Pat yourself dry with a clean towel.            10.  Wear clean pajamas.            11.  Place clean sheets on your bed the night of your first shower and do not  sleep with pets. Day of Surgery : Do not apply any lotions/deodorants the morning of surgery.  Please wear clean clothes to the hospital/surgery center.  FAILURE TO FOLLOW THESE INSTRUCTIONS MAY RESULT IN THE CANCELLATION OF YOUR SURGERY PATIENT SIGNATURE_________________________________  NURSE SIGNATURE__________________________________  ________________________________________________________________________   Kayla Owens  An incentive spirometer is a tool that can help keep your lungs clear and active. This tool measures how well you are filling your lungs with each breath. Taking long deep breaths may help reverse or decrease the chance of developing breathing (pulmonary) problems (especially infection) following:  A long period of time when you are unable to move or be active. BEFORE THE PROCEDURE   If  the spirometer includes an indicator to show your best effort, your nurse or respiratory therapist will set it to a desired goal.  If possible, sit up straight or lean slightly forward. Try not to slouch.  Hold the incentive spirometer in an upright position. INSTRUCTIONS FOR USE  1. Sit on the edge of your bed if possible, or sit up as far as you can in bed or on a chair. 2. Hold the incentive spirometer in an upright position. 3. Breathe out normally. 4. Place the mouthpiece in your mouth and seal your lips tightly around it. 5. Breathe in slowly and as deeply as possible, raising the piston or the ball toward the top of the column. 6. Hold your breath for 3-5 seconds or for as long as possible. Allow the piston  or ball to fall to the bottom of the column. 7. Remove the mouthpiece from your mouth and breathe out normally. 8. Rest for a few seconds and repeat Steps 1 through 7 at least 10 times every 1-2 hours when you are awake. Take your time and take a few normal breaths between deep breaths. 9. The spirometer may include an indicator to show your best effort. Use the indicator as a goal to work toward during each repetition. 10. After each set of 10 deep breaths, practice coughing to be sure your lungs are clear. If you have an incision (the cut made at the time of surgery), support your incision when coughing by placing a pillow or rolled up towels firmly against it. Once you are able to get out of bed, walk around indoors and cough well. You may stop using the incentive spirometer when instructed by your caregiver.  RISKS AND COMPLICATIONS  Take your time so you do not get dizzy or light-headed.  If you are in pain, you may need to take or ask for pain medication before doing incentive spirometry. It is harder to take a deep breath if you are having pain. AFTER USE  Rest and breathe slowly and easily.  It can be helpful to keep track of a log of your progress. Your caregiver can  provide you with a simple table to help with this. If you are using the spirometer at home, follow these instructions: SEEK MEDICAL CARE IF:   You are having difficultly using the spirometer.  You have trouble using the spirometer as often as instructed.  Your pain medication is not giving enough relief while using the spirometer.  You develop fever of 100.5 F (38.1 C) or higher. SEEK IMMEDIATE MEDICAL CARE IF:   You cough up bloody sputum that had not been present before.  You develop fever of 102 F (38.9 C) or greater.  You develop worsening pain at or near the incision site. MAKE SURE YOU:   Understand these instructions.  Will watch your condition.  Will get help right away if you are not doing well or get worse. Document Released: 08/04/2006 Document Revised: 06/16/2011 Document Reviewed: 10/05/2006 ExitCare Patient Information 2014 [employer], [place].   ________________________________________________________________________  WHAT IS A BLOOD TRANSFUSION? Blood Transfusion Information  A transfusion is the replacement of blood or some of its parts. Blood is made up of multiple cells which provide different functions.  Red blood cells carry oxygen and are used for blood loss replacement.  White blood cells fight against infection.  Platelets control bleeding.  Plasma helps clot blood.  Other blood products are available for specialized needs, such as hemophilia or other clotting disorders. BEFORE THE TRANSFUSION  Who gives blood for transfusions?   Healthy volunteers who are fully evaluated to make sure their blood is safe. This is blood bank blood. Transfusion therapy is the safest it has ever been in the practice of medicine. Before blood is taken from a donor, a complete history is taken to make sure that person has no history of diseases nor engages in risky social behavior (examples are intravenous drug use or sexual activity with multiple partners). The  donor's travel history is screened to minimize risk of transmitting infections, such as malaria. The donated blood is tested for signs of infectious diseases, such as HIV and hepatitis. The blood is then tested to be sure it is compatible with you in order to minimize the chance of a transfusion reaction. If you  or a relative donates blood, this is often done in anticipation of surgery and is not appropriate for emergency situations. It takes many days to process the donated blood. RISKS AND COMPLICATIONS Although transfusion therapy is very safe and saves many lives, the main dangers of transfusion include:   Getting an infectious disease.  Developing a transfusion reaction. This is an allergic reaction to something in the blood you were given. Every precaution is taken to prevent this. The decision to have a blood transfusion has been considered carefully by your caregiver before blood is given. Blood is not given unless the benefits outweigh the risks. AFTER THE TRANSFUSION  Right after receiving a blood transfusion, you will usually feel much better and more energetic. This is especially true if your red blood cells have gotten low (anemic). The transfusion raises the level of the red blood cells which carry oxygen, and this usually causes an energy increase.  The nurse administering the transfusion will monitor you carefully for complications. HOME CARE INSTRUCTIONS  No special instructions are needed after a transfusion. You may find your energy is better. Speak with your caregiver about any limitations on activity for underlying diseases you may have. SEEK MEDICAL CARE IF:   Your condition is not improving after your transfusion.  You develop redness or irritation at the intravenous (IV) site. SEEK IMMEDIATE MEDICAL CARE IF:  Any of the following symptoms occur over the next 12 hours:  Shaking chills.  You have a temperature by mouth above 102 F (38.9 C), not controlled by  medicine.  Chest, back, or muscle pain.  People around you feel you are not acting correctly or are confused.  Shortness of breath or difficulty breathing.  Dizziness and fainting.  You get a rash or develop hives.  You have a decrease in urine output.  Your urine turns a dark color or changes to pink, red, or brown. Any of the following symptoms occur over the next 10 days:  You have a temperature by mouth above 102 F (38.9 C), not controlled by medicine.  Shortness of breath.  Weakness after normal activity.  The white part of the eye turns yellow (jaundice).  You have a decrease in the amount of urine or are urinating less often.  Your urine turns a dark color or changes to pink, red, or brown. Document Released: 03/21/2000 Document Revised: 06/16/2011 Document Reviewed: 11/08/2007 [facility] Patient Information 2014 [employer], [place].  _______________________________________________________________________

## 2018-04-28 ENCOUNTER — Inpatient Hospital Stay (HOSPITAL_COMMUNITY)
Admission: RE | Admit: 2018-04-28 | Discharge: 2018-04-28 | Disposition: A | Discharge status: Home / Self Care | Payer: Self-pay | Source: Ambulatory Visit

## 2018-04-28 ENCOUNTER — Encounter (HOSPITAL_COMMUNITY): Payer: Self-pay

## 2018-04-28 DIAGNOSIS — J4540 Moderate persistent asthma, uncomplicated: Secondary | ICD-10-CM | POA: Insufficient documentation

## 2018-04-28 DIAGNOSIS — F419 Anxiety disorder, unspecified: Secondary | ICD-10-CM | POA: Insufficient documentation

## 2018-04-28 DIAGNOSIS — F332 Major depressive disorder, recurrent severe without psychotic features: Secondary | ICD-10-CM | POA: Insufficient documentation

## 2018-04-28 HISTORY — DX: Chronic migraine without aura, intractable, without status migrainosus: G43.719

## 2018-04-29 ENCOUNTER — Encounter (HOSPITAL_COMMUNITY): Payer: Self-pay | Admitting: Physician Assistant

## 2018-04-29 ENCOUNTER — Telehealth: Payer: Self-pay | Admitting: *Deleted

## 2018-04-29 NOTE — Telephone Encounter (Signed)
Attempted yesterday and toady to reach the patient regarding her missed appt from yesterday for pre-admission. Unable to reach the patient. Called and spoke with the patient's mother regarding the missed appt from yesterday for pre-admissions. Per the patient's mother "Her phone is off. I have text the boyfriend, he got back with me. I have told them that she needs to call the office for her blood check appt. I have been told that she was not going to go through with the surgery, but I haven't heard that from her. I will definitely have her call you regarding the appt/surgery regardless with her goings through with it or not."  Thanked the mother for her help. [facility]

## 2018-05-03 ENCOUNTER — Encounter: Payer: Self-pay | Admitting: Gynecologic Oncology

## 2018-05-03 ENCOUNTER — Telehealth: Payer: Self-pay | Admitting: *Deleted

## 2018-05-03 NOTE — Telephone Encounter (Signed)
Patient called and stated "I got a message on my chart regarding my appt. I want to going ahead and cancel the surgery for tomorrow. I went somewhere else and got a second option." Melissa APP notified

## 2018-05-03 NOTE — Telephone Encounter (Signed)
Per Efraim Kaufmann APP mailed letter

## 2018-05-03 NOTE — Telephone Encounter (Signed)
Called and spoke with the Sue Lush at Dr. Amado Nash office. Explained to Sue Lush that the patient no showed for her per admission appt and we have canceled her surgery for tomorrow. Explained that I have spoke with the patient's mother and that conversation (see earlier note).

## 2018-05-04 ENCOUNTER — Encounter (HOSPITAL_COMMUNITY): Admission: RE | Payer: Self-pay | Source: Home / Self Care

## 2018-05-04 ENCOUNTER — Ambulatory Visit (HOSPITAL_COMMUNITY)
Admission: RE | Admit: 2018-05-04 | Payer: Managed Care, Other (non HMO) | Source: Home / Self Care | Admitting: Gynecologic Oncology

## 2018-05-04 SURGERY — EXCISION, CYST, OVARY, ROBOT-ASSISTED, LAPAROSCOPIC
Anesthesia: General | Laterality: Right

## 2018-05-31 ENCOUNTER — Ambulatory Visit: Payer: Self-pay | Admitting: Gynecologic Oncology

## 2018-06-09 ENCOUNTER — Encounter: Payer: Self-pay | Admitting: *Deleted

## 2018-06-09 DIAGNOSIS — Z720 Tobacco use: Secondary | ICD-10-CM | POA: Insufficient documentation

## 2018-06-09 NOTE — Progress Notes (Unsigned)
06/09/2018 submitted prior auth via Cover my meds: received response:  There was an error with your request  ?  Eligibility could not be verified for this patient - patient not found. Please review patient information. Called number on back of card [phone] spoke with Dara call ref # [id number] She checked both [phone] and [id number] No prior auth required it is based on medical necessity pt responsible for 25% if out of network provider is used.

## 2018-06-11 ENCOUNTER — Ambulatory Visit (INDEPENDENT_AMBULATORY_CARE_PROVIDER_SITE_OTHER): Payer: Managed Care, Other (non HMO) | Admitting: Neurology

## 2018-06-11 DIAGNOSIS — G43719 Chronic migraine without aura, intractable, without status migrainosus: Secondary | ICD-10-CM

## 2018-06-11 MED ORDER — ONABOTULINUMTOXINA 100 UNITS IJ SOLR
165.0000 [IU] | Freq: Once | INTRAMUSCULAR | Status: AC
  Administered 2018-06-11: 165 [IU] via INTRAMUSCULAR

## 2018-06-11 MED ORDER — KETOROLAC TROMETHAMINE 60 MG/2ML IM SOLN
60.0000 mg | Freq: Once | INTRAMUSCULAR | Status: AC
  Administered 2018-06-11: 60 mg via INTRAMUSCULAR

## 2018-06-12 ENCOUNTER — Inpatient Hospital Stay (HOSPITAL_COMMUNITY)
Admission: EM | Admit: 2018-06-12 | Discharge: 2018-06-14 | DRG: 103 | Disposition: A | Discharge status: Home / Self Care | Payer: Managed Care, Other (non HMO) | Attending: Internal Medicine | Admitting: Internal Medicine

## 2018-06-12 ENCOUNTER — Encounter (HOSPITAL_COMMUNITY): Payer: Self-pay | Admitting: Emergency Medicine

## 2018-06-12 ENCOUNTER — Emergency Department (HOSPITAL_COMMUNITY): Payer: Managed Care, Other (non HMO)

## 2018-06-12 ENCOUNTER — Other Ambulatory Visit: Payer: Self-pay

## 2018-06-12 DIAGNOSIS — Z7951 Long term (current) use of inhaled steroids: Secondary | ICD-10-CM | POA: Diagnosis not present

## 2018-06-12 DIAGNOSIS — Z79899 Other long term (current) drug therapy: Secondary | ICD-10-CM | POA: Diagnosis not present

## 2018-06-12 DIAGNOSIS — Z832 Family history of diseases of the blood and blood-forming organs and certain disorders involving the immune mechanism: Secondary | ICD-10-CM | POA: Diagnosis not present

## 2018-06-12 DIAGNOSIS — G43109 Migraine with aura, not intractable, without status migrainosus: Secondary | ICD-10-CM | POA: Diagnosis not present

## 2018-06-12 DIAGNOSIS — R51 Headache: Secondary | ICD-10-CM

## 2018-06-12 DIAGNOSIS — Z7982 Long term (current) use of aspirin: Secondary | ICD-10-CM

## 2018-06-12 DIAGNOSIS — J4540 Moderate persistent asthma, uncomplicated: Secondary | ICD-10-CM | POA: Diagnosis present

## 2018-06-12 DIAGNOSIS — R519 Headache, unspecified: Secondary | ICD-10-CM

## 2018-06-12 DIAGNOSIS — F17210 Nicotine dependence, cigarettes, uncomplicated: Secondary | ICD-10-CM | POA: Diagnosis present

## 2018-06-12 DIAGNOSIS — Z8041 Family history of malignant neoplasm of ovary: Secondary | ICD-10-CM

## 2018-06-12 DIAGNOSIS — F419 Anxiety disorder, unspecified: Secondary | ICD-10-CM | POA: Diagnosis present

## 2018-06-12 DIAGNOSIS — A879 Viral meningitis, unspecified: Secondary | ICD-10-CM | POA: Diagnosis present

## 2018-06-12 DIAGNOSIS — Z8249 Family history of ischemic heart disease and other diseases of the circulatory system: Secondary | ICD-10-CM | POA: Diagnosis not present

## 2018-06-12 DIAGNOSIS — Z8261 Family history of arthritis: Secondary | ICD-10-CM

## 2018-06-12 DIAGNOSIS — Z800 Family history of malignant neoplasm of digestive organs: Secondary | ICD-10-CM | POA: Diagnosis not present

## 2018-06-12 DIAGNOSIS — Z791 Long term (current) use of non-steroidal anti-inflammatories (NSAID): Secondary | ICD-10-CM

## 2018-06-12 DIAGNOSIS — F329 Major depressive disorder, single episode, unspecified: Secondary | ICD-10-CM | POA: Diagnosis present

## 2018-06-12 LAB — CSF CELL COUNT WITH DIFFERENTIAL
Lymphs, CSF: 80 % (ref 40–80)
Lymphs, CSF: 92 % — ABNORMAL HIGH (ref 40–80)
Monocyte-Macrophage-Spinal Fluid: 19 % (ref 15–45)
Monocyte-Macrophage-Spinal Fluid: 8 % — ABNORMAL LOW (ref 15–45)
RBC Count, CSF: 45 /mm3 — ABNORMAL HIGH
RBC Count, CSF: 45 /mm3 — ABNORMAL HIGH
Segmented Neutrophils-CSF: 0 % (ref 0–6)
Segmented Neutrophils-CSF: 1 % (ref 0–6)
Tube #: 1
Tube #: 4
WBC, CSF: 705 /mm3 (ref 0–5)
WBC, CSF: 820 /mm3 (ref 0–5)

## 2018-06-12 LAB — BASIC METABOLIC PANEL
Anion gap: 7 (ref 5–15)
BUN: 7 mg/dL (ref 6–20)
CO2: 24 mmol/L (ref 22–32)
Calcium: 9.4 mg/dL (ref 8.9–10.3)
Chloride: 107 mmol/L (ref 98–111)
Creatinine, Ser: 1.03 mg/dL — ABNORMAL HIGH (ref 0.44–1.00)
GFR calc Af Amer: >60 mL/min (ref >60)
GFR calc non Af Amer: >60 mL/min (ref >60)
Glucose, Bld: 108 mg/dL — ABNORMAL HIGH (ref 70–99)
Potassium: 3.6 mmol/L (ref 3.5–5.1)
Sodium: 138 mmol/L (ref 135–145)

## 2018-06-12 LAB — CBC WITH DIFFERENTIAL/PLATELET
Abs Immature Granulocytes: 0.02 10*3/uL (ref 0.00–0.07)
Basophils Absolute: 0 10*3/uL (ref 0.0–0.1)
Basophils Relative: 0 %
Eosinophils Absolute: 0 10*3/uL (ref 0.0–0.5)
Eosinophils Relative: 1 %
HCT: 38.9 % (ref 36.0–46.0)
Hemoglobin: 12.6 g/dL (ref 12.0–15.0)
Immature Granulocytes: 0 %
Lymphocytes Relative: 35 %
Lymphs Abs: 2 10*3/uL (ref 0.7–4.0)
MCH: 29.9 pg (ref 26.0–34.0)
MCHC: 32.4 g/dL (ref 30.0–36.0)
MCV: 92.2 fL (ref 80.0–100.0)
Monocytes Absolute: 0.4 10*3/uL (ref 0.1–1.0)
Monocytes Relative: 7 %
Neutro Abs: 3.2 10*3/uL (ref 1.7–7.7)
Neutrophils Relative %: 57 %
Platelets: 309 10*3/uL (ref 150–400)
RBC: 4.22 MIL/uL (ref 3.87–5.11)
RDW: 13.6 % (ref 11.5–15.5)
WBC: 5.6 10*3/uL (ref 4.0–10.5)
nRBC: 0 % (ref 0.0–0.2)

## 2018-06-12 LAB — URINALYSIS, ROUTINE W REFLEX MICROSCOPIC
Bilirubin Urine: NEGATIVE
Glucose, UA: NEGATIVE mg/dL
Hgb urine dipstick: NEGATIVE
Ketones, ur: NEGATIVE mg/dL
Leukocytes,Ua: NEGATIVE
Nitrite: NEGATIVE
Protein, ur: NEGATIVE mg/dL
Specific Gravity, Urine: 1.013 (ref 1.005–1.030)
pH: 6 (ref 5.0–8.0)

## 2018-06-12 LAB — PREGNANCY, URINE: Preg Test, Ur: NEGATIVE

## 2018-06-12 LAB — PROTEIN AND GLUCOSE, CSF
Glucose, CSF: 47 mg/dL (ref 40–70)
Total  Protein, CSF: 87 mg/dL — ABNORMAL HIGH (ref 15–45)

## 2018-06-12 MED ORDER — ACETAMINOPHEN 500 MG PO TABS
1000.0000 mg | ORAL_TABLET | Freq: Four times a day (QID) | ORAL | Status: DC | PRN
  Administered 2018-06-14: 1000 mg via ORAL
  Filled 2018-06-12: qty 2

## 2018-06-12 MED ORDER — LIDOCAINE HCL (PF) 1 % IJ SOLN
10.0000 mL | Freq: Once | INTRAMUSCULAR | Status: AC
  Administered 2018-06-12: 10 mL
  Filled 2018-06-12: qty 10

## 2018-06-12 MED ORDER — MONTELUKAST SODIUM 10 MG PO TABS
10.0000 mg | ORAL_TABLET | Freq: Every day | ORAL | Status: DC
  Administered 2018-06-12 – 2018-06-13 (×2): 10 mg via ORAL
  Filled 2018-06-12 (×2): qty 1

## 2018-06-12 MED ORDER — ESCITALOPRAM OXALATE 10 MG PO TABS
10.0000 mg | ORAL_TABLET | Freq: Every day | ORAL | Status: DC
  Administered 2018-06-12 – 2018-06-13 (×2): 10 mg via ORAL
  Filled 2018-06-12 (×2): qty 1

## 2018-06-12 MED ORDER — ONDANSETRON HCL 4 MG PO TABS
4.0000 mg | ORAL_TABLET | Freq: Four times a day (QID) | ORAL | Status: DC | PRN
  Administered 2018-06-14: 4 mg via ORAL
  Filled 2018-06-12: qty 1

## 2018-06-12 MED ORDER — POLYETHYLENE GLYCOL 3350 17 G PO PACK: 17.0000 g | PACK | Freq: Every day | ORAL | Status: DC | PRN

## 2018-06-12 MED ORDER — TRAZODONE HCL 50 MG PO TABS: 50.0000 mg | ORAL_TABLET | Freq: Every evening | ORAL | Status: DC | PRN

## 2018-06-12 MED ORDER — ALBUTEROL SULFATE (2.5 MG/3ML) 0.083% IN NEBU: 2.5000 mg | INHALATION_SOLUTION | Freq: Four times a day (QID) | RESPIRATORY_TRACT | Status: DC | PRN

## 2018-06-12 MED ORDER — BECLOMETHASONE DIPROP HFA 40 MCG/ACT IN AERB: 2.0000 | INHALATION_SPRAY | Freq: Two times a day (BID) | RESPIRATORY_TRACT | Status: DC

## 2018-06-12 MED ORDER — SODIUM CHLORIDE 0.9 % IV BOLUS
500.0000 mL | Freq: Once | INTRAVENOUS | Status: AC
  Administered 2018-06-12: 500 mL via INTRAVENOUS

## 2018-06-12 MED ORDER — ONDANSETRON HCL 4 MG/2ML IJ SOLN
4.0000 mg | Freq: Four times a day (QID) | INTRAMUSCULAR | Status: DC | PRN
  Administered 2018-06-13: 4 mg via INTRAVENOUS
  Filled 2018-06-12: qty 2

## 2018-06-12 MED ORDER — BUDESONIDE 0.25 MG/2ML IN SUSP
0.2500 mg | Freq: Two times a day (BID) | RESPIRATORY_TRACT | Status: DC
  Administered 2018-06-12 – 2018-06-14 (×4): 0.25 mg via RESPIRATORY_TRACT
  Filled 2018-06-12 (×4): qty 2

## 2018-06-12 MED ORDER — METOCLOPRAMIDE HCL 5 MG/ML IJ SOLN: 10.0000 mg | Freq: Once | INTRAMUSCULAR | Status: DC

## 2018-06-12 MED ORDER — VANCOMYCIN HCL IN DEXTROSE 1-5 GM/200ML-% IV SOLN: 1000.0000 mg | Freq: Three times a day (TID) | INTRAVENOUS | Status: DC

## 2018-06-12 MED ORDER — SODIUM CHLORIDE 0.9 % IV SOLN
2.0000 g | Freq: Two times a day (BID) | INTRAVENOUS | Status: DC
  Administered 2018-06-12: 2 g via INTRAVENOUS
  Filled 2018-06-12: qty 20

## 2018-06-12 MED ORDER — VANCOMYCIN HCL IN DEXTROSE 1-5 GM/200ML-% IV SOLN
1000.0000 mg | Freq: Once | INTRAVENOUS | Status: DC
  Administered 2018-06-12: 1000 mg via INTRAVENOUS
  Filled 2018-06-12: qty 200

## 2018-06-12 MED ORDER — CYCLOBENZAPRINE HCL 10 MG PO TABS: 10.0000 mg | ORAL_TABLET | Freq: Once | ORAL | Status: DC

## 2018-06-12 MED ORDER — DIAZEPAM 5 MG PO TABS
5.0000 mg | ORAL_TABLET | Freq: Once | ORAL | Status: AC
  Administered 2018-06-12: 5 mg via ORAL
  Filled 2018-06-12: qty 1

## 2018-06-12 MED ORDER — DEXTROSE 5 % IV SOLN
830.0000 mg | Freq: Three times a day (TID) | INTRAVENOUS | Status: DC
  Administered 2018-06-12 – 2018-06-13 (×4): 830 mg via INTRAVENOUS
  Filled 2018-06-12 (×5): qty 16.6

## 2018-06-12 MED ORDER — LACTATED RINGERS IV SOLN
INTRAVENOUS | Status: DC
  Administered 2018-06-12 – 2018-06-14 (×2): via INTRAVENOUS

## 2018-06-12 MED ORDER — DEXTROSE 5 % IV SOLN: 10.0000 mg/kg | Freq: Once | INTRAVENOUS | Status: DC

## 2018-06-12 MED ORDER — SUMATRIPTAN SUCCINATE 100 MG PO TABS: 50.0000 mg | ORAL_TABLET | ORAL | Status: DC | PRN

## 2018-06-12 MED ORDER — MORPHINE SULFATE (PF) 2 MG/ML IV SOLN
1.0000 mg | INTRAVENOUS | Status: DC | PRN
  Administered 2018-06-13: 2 mg via INTRAVENOUS
  Filled 2018-06-12: qty 1

## 2018-06-12 MED ORDER — VANCOMYCIN HCL IN DEXTROSE 1-5 GM/200ML-% IV SOLN: 1000.0000 mg | Freq: Once | INTRAVENOUS | Status: DC

## 2018-06-12 MED ORDER — ZOLMITRIPTAN 5 MG NA SOLN: 1.0000 | NASAL | Status: DC | PRN

## 2018-06-12 MED ORDER — DEXAMETHASONE SODIUM PHOSPHATE 10 MG/ML IJ SOLN
10.0000 mg | Freq: Once | INTRAMUSCULAR | Status: AC
  Administered 2018-06-12: 10 mg via INTRAVENOUS
  Filled 2018-06-12: qty 1

## 2018-06-12 MED ORDER — DROPERIDOL 2.5 MG/ML IJ SOLN
2.5000 mg | Freq: Once | INTRAMUSCULAR | Status: DC
  Filled 2018-06-12: qty 2
  Filled 2018-06-12: qty 1

## 2018-06-12 MED ORDER — DROPERIDOL 2.5 MG/ML IJ SOLN
1.2500 mg | Freq: Once | INTRAMUSCULAR | Status: AC
  Administered 2018-06-12: 1.25 mg via INTRAVENOUS
  Filled 2018-06-12: qty 0.5

## 2018-06-12 MED ORDER — OXYCODONE HCL 5 MG PO TABS: 5.0000 mg | ORAL_TABLET | ORAL | Status: DC | PRN

## 2018-06-12 NOTE — Progress Notes (Signed)
Pharmacy Antibiotic Note  Kayla Owens is a 28 y.o. female admitted on 06/12/2018 with suspected meningitis.  Pharmacy has been consulted for vancomycin and acyclovir dosing.  Ceftriaxone 2g q12h ordered per MD.  SCr 1.03, CrCl ~ 108 normalized  Plan: Vancomycin 2000mg  IV x 1, then 1000mg  IV every 8 hours Acyclovir 830mg  IV every 8 hours 10mg /kg (AdjBW of 83kg)  Monitor renal function, CSF and ID recs Vancomycin level at steady state  Height: 5' 7.5" (171.5 cm) Weight: 250 lb (113.4 kg) IBW/kg (Calculated) : 62.75  Temp (24hrs), Avg:98.6 F (37 C), Min:98.6 F (37 C), Max:98.6 F (37 C)  Recent Labs  Lab 06/12/18 0927  WBC 5.6  CREATININE 1.03*    Estimated Creatinine Clearance: 106.5 mL/min (A) (by C-G formula based on SCr of 1.03 mg/dL (H)).    No Known Allergies  Antimicrobials this admission: Vanc 3/7>> Acyclovir 3/7>> Ceftriaxone 3/7>>  Dose adjustments this admission: n/a  Microbiology results: 3/7 CSF: sent  Daylene Posey, PharmD Clinical Pharmacist Please check AMION for all [facility] Pharmacy numbers 06/12/2018 2:51 PM

## 2018-06-12 NOTE — ED Notes (Signed)
PA Ward requesting patient be moved to more acute treatment area for further evaluation of her symptoms. Charge RN Tobi Bastos made aware

## 2018-06-12 NOTE — ED Triage Notes (Signed)
Patient reports hx of migraines, states she has a headache that she has had for the past 72 hours with stiff neck and chills. She reports h/a is worse than her normal migraine. She got her botox shots that she normally gets for her migraines, she has also had toradol and goody powder. 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

## 2018-06-12 NOTE — H&P (Signed)
History and Physical    Kayla Owens QMV:[id number] DOB: [birth year] DOA: 06/12/2018  PCP: Evaristo Bury, NP  Patient coming from: Home  Chief Complaint: Headache  HPI: Kayla Owens is a 28 y.o. female with medical history significant of migraines.  3 days ago, the patient started having a headache.  She had 2 episodes of vomiting associated with this.  She took some over-the-counter medication and started to improve.  2 days ago, her headache significantly worsened.  When her medicine did not work, she contacted her neurologist who gave her a Toradol injection and performed Botox injections.  This normally helps, but when it did not, she came in to seek medical care.  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  No injury or change in activity.  She denies any new rashes.  She does not take medication for anything other than her headaches.  As she has already had medication in the emergency department, she feels better overall.  ED Course: LP showed predominantly lymphocytes without any neutrophils.  Trace amount of blood.  She was started on vancomycin, acyclovir, and Rocephin.  Labs otherwise unremarkable.  A CT of the head was normal.  Review of Systems: As per HPI otherwise 10 point review of systems negative.   Past Medical History:  Diagnosis Date  . Anxiety   . Asthma   . Depression   . Intractable chronic migraine without aura    neurologist-- dr Shon Millet (receives botox treatment)    Past Surgical History:  Procedure Laterality Date  . biopsy of cervix    . WISDOM TOOTH EXTRACTION       reports that she has been smoking cigarettes. She has never used smokeless tobacco. She reports that she does not drink alcohol or use drugs.  No Known Allergies  Family History  Problem Relation Age of Onset  . Arthritis Mother   . Hypertension Mother   . Lupus Mother   . Heart disease Maternal Grandmother   . Heart disease Maternal  Grandfather   . Hypertension Maternal Grandfather   . Arthritis Paternal Grandmother   . Stomach cancer Paternal Grandfather   . Ovarian cancer Maternal Aunt   . Ovarian cancer Maternal Aunt    Prior to Admission medications   Medication Sig Start Date End Date Taking? Authorizing Provider  acetaminophen (TYLENOL) 500 MG tablet Take 1,000 mg by mouth every 6 (six) hours as needed for mild pain or headache.   Yes [provider]  albuterol (PROVENTIL HFA;VENTOLIN HFA) 108 (90 Base) MCG/ACT inhaler Inhale 1-2 puffs into the lungs every 6 (six) hours as needed for wheezing or shortness of breath. 02/06/17  Yes Evaristo Bury, NP  aspirin-acetaminophen-caffeine (EXCEDRIN MIGRAINE) [phone] MG tablet Take 2 tablets by mouth every 6 (six) hours as needed for headache or migraine.   Yes [provider]  beclomethasone (QVAR) 40 MCG/ACT inhaler Inhale 2 puffs into the lungs daily. Patient taking differently: Inhale 2 puffs into the lungs 2 (two) times daily.  02/06/17  Yes Shambley, Audie Box, NP  Black Currant Seed Oil 500 MG CAPS Take 500 capsules by mouth daily.   Yes [provider]  cholecalciferol (VITAMIN D3) 25 MCG (1000 UT) tablet Take 1,000 Units by mouth daily.   Yes [provider]  escitalopram (LEXAPRO) 10 MG tablet Take 10 mg by mouth at bedtime. 04/28/18  Yes [provider]  ibuprofen (ADVIL,MOTRIN) 600 MG tablet Take 1 tablet (  600 mg total) by mouth every 6 (six) hours as needed for moderate pain. For AFTER surgery 04/14/18  Yes Cross, Melissa D, NP  montelukast (SINGULAIR) 10 MG tablet Take 1 tablet (10 mg total) by mouth at bedtime. 02/06/17  Yes Shambley, Audie Box, NP  Omega-3 Fatty Acids (FISH OIL) 1000 MG CAPS Take 1,000 mg by mouth daily.    Yes [provider]  OnabotulinumtoxinA (BOTOX IJ) Inject 1 each as directed every 3 (three) months.   Yes [provider]  zolmitriptan (ZOMIG) 5 MG nasal solution 1 spray  in one nostril at earliest onset of migraine.  May repeat dose x1 in 2h Patient taking differently: Place 2 sprays into the nose every 4 (four) hours as needed for migraine.  02/17/18  Yes Drema Dallas, DO    Physical Exam: Vitals:   06/12/18 1535 06/12/18 1600 06/12/18 1630 06/12/18 1702  BP: 124/69 116/73 105/78 115/76  Pulse: 67 73 84 80  Resp: 15 15 (!) 28 (!) 24  Temp:      TempSrc:      SpO2: 100% 100% 100% 99%  Weight:      Height:        Constitutional: NAD, calm, comfortable Eyes: PERRL, lids and conjunctivae normal ENMT: Mucous membranes are moist. Posterior pharynx clear of any exudate or lesions.Normal dentition.  Neck: normal, supple, no masses, no thyromegaly Respiratory: clear to auscultation bilaterally, no wheezing, no crackles. Normal respiratory effort. No accessory muscle use.  Cardiovascular: Regular rate and rhythm, no murmurs / rubs / gallops. No extremity edema. 2+ pedal pulses. No carotid bruits.  Abdomen: no tenderness, no masses palpated. No hepatosplenomegaly. Bowel sounds positive.  Musculoskeletal: no clubbing / cyanosis. No joint deformity upper and lower extremities.  Grip strength adequate, +ttp over paraspinal musculature bilaterally Skin: no rashes, lesions, ulcers.  Neurologic: CN 2-12 grossly intact. Sensation intact, DTR normal. Strength 5/5 in all 4. +Kernig's.  Psychiatric: Normal judgment and insight. Alert and oriented x 3. Normal mood.   Labs on Admission: I have personally reviewed following labs and imaging studies  CBC: Recent Labs  Lab 06/12/18 0927  WBC 5.6  NEUTROABS 3.2  HGB 12.6  HCT 38.9  MCV 92.2  PLT 309   Basic Metabolic Panel: Recent Labs  Lab 06/12/18 0927  NA 138  K 3.6  CL 107  CO2 24  GLUCOSE 108*  BUN 7  CREATININE 1.03*  CALCIUM 9.4   Urine analysis:    Component Value Date/Time   COLORURINE YELLOW 06/12/2018 1513   APPEARANCEUR CLEAR 06/12/2018 1513   LABSPEC 1.013 06/12/2018 1513   PHURINE  6.0 06/12/2018 1513   GLUCOSEU NEGATIVE 06/12/2018 1513   HGBUR NEGATIVE 06/12/2018 1513   BILIRUBINUR NEGATIVE 06/12/2018 1513   KETONESUR NEGATIVE 06/12/2018 1513   PROTEINUR NEGATIVE 06/12/2018 1513   UROBILINOGEN 0.2 04/23/2014 0315   NITRITE NEGATIVE 06/12/2018 1513   LEUKOCYTESUR NEGATIVE 06/12/2018 1513   Recent Results (from the past 240 hour(s))  Culture, blood (routine x 2)     Status: None (Preliminary result)   Collection Time: 06/12/18  9:28 AM  Result Value Ref Range Status   Specimen Description BLOOD RIGHT ANTECUBITAL  Final   Special Requests   Final    BOTTLES DRAWN AEROBIC AND ANAEROBIC Blood Culture results may not be optimal due to an excessive volume of blood received in culture bottles   Culture   Final    NO GROWTH < 12 HOURS Performed at [facility]  Lab, [address], [place], [place] [address]    Report Status PENDING  Incomplete  Culture, blood (routine x 2)     Status: None (Preliminary result)   Collection Time: 06/12/18  9:31 AM  Result Value Ref Range Status   Specimen Description BLOOD LEFT ANTECUBITAL  Final   Special Requests   Final    BOTTLES DRAWN AEROBIC AND ANAEROBIC Blood Culture adequate volume   Culture   Final    NO GROWTH < 12 HOURS Performed at [facility], [address], [place], [place] [address]    Report Status PENDING  Incomplete  CSF culture     Status: None (Preliminary result)   Collection Time: 06/12/18 11:45 AM  Result Value Ref Range Status   Specimen Description CSF  Final   Special Requests Normal  Final   Gram Stain   Final    WBC PRESENT, PREDOMINANTLY MONONUCLEAR NO ORGANISMS SEEN CYTOSPIN SMEAR Performed at [facility], [address] [place], [place] [address]    Culture PENDING  Incomplete   Report Status PENDING  Incomplete     Radiological Exams on Admission: Ct Head Wo Contrast  Result Date: 06/12/2018 CLINICAL DATA:  Headaches EXAM: CT HEAD WITHOUT CONTRAST TECHNIQUE: Contiguous  axial images were obtained from the base of the skull through the vertex without intravenous contrast. COMPARISON:  10/27/2017 FINDINGS: Brain: No evidence of acute infarction, hemorrhage, hydrocephalus, extra-axial collection or mass lesion/mass effect. Vascular: No hyperdense vessel or unexpected calcification. Skull: Normal. Negative for fracture or focal lesion. Sinuses/Orbits: No acute finding. Other: None. IMPRESSION: No acute intracranial abnormality noted. Electronically Signed   By: Alcide Clever M.D.   On: 06/12/2018 10:47    EKG: Independently reviewed.   Assessment/Plan Principal Problem:   Viral meningitis  Tylenol 650 every 6 hours as needed mild pain  Oxycodone 5 mg every 6 hours for moderate pain  Morphine 1-3 mg every 2 hours as needed for severe pain  Continue acyclovir, pharmacy to dose  Given appearance of patient and LP fluid, will stop antibiotics  Zofran as needed for nausea  Lactated Ringer 75 mL an hour due to poor oral intake/ nausea   Active Problems:   Migraine with aura and without status migrainosus, not intractable  Continue home medications   Moderate persistent asthma  Continue Qvar and Singulair, albuterol as needed   Depression  Cont Lexapro   DVT prophylaxis: SCDs Code Status: Full  Family Communication: Self Disposition Plan: Home Consults called: ID called by ED, did not want to consult  Admission status: Inpatient   Severity of Illness: The appropriate patient status for this patient is INPATIENT. Inpatient status is judged to be reasonable and necessary in order to provide the required intensity of service to ensure the patient's safety. The patient's presenting symptoms, physical exam findings, and initial radiographic and laboratory data in the context of their chronic comorbidities is felt to place them at high risk for further clinical deterioration. Furthermore, it is not anticipated that the patient will be medically stable for discharge  from the hospital within 2 midnights of admission. The following factors support the patient status of inpatient.   " The patient's presenting symptoms include Headache/neck pain. " The worrisome physical exam findings include +Kernig's. " The initial radiographic and laboratory data are worrisome because of increased lymphocytes in CSF. " The chronic co-morbidities include migraines.   * I certify that at the point of admission it is my clinical  judgment that the patient will require inpatient hospital care spanning beyond 2 midnights from the point of admission due to high intensity of service, high risk for further deterioration and high frequency of surveillance required.*   Sharlene Dory, DO [facility] [email or web address] 06/12/2018, 5:31 PM

## 2018-06-12 NOTE — ED Notes (Signed)
ED TO INPATIENT HANDOFF REPORT  ED Nurse Name and Phone #: Florentina Addison [phone]  S Name/Age/Gender Flossie Dibble 28 y.o. female Room/Bed: 043C/043C  Code Status   Code Status: Full Code  Home/SNF/Other Home Patient oriented to: self, place, time and situation Is this baseline? Yes   Triage Complete: Triage complete  Chief Complaint migraine  Triage Note Patient reports hx of migraines, states she has a headache that she has had for the past 72 hours with stiff neck and chills. She reports h/a is worse than her normal migraine. She got her botox shots that she normally gets for her migraines, she has also had toradol and goody powder. 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    Allergies No Known Allergies  Level of Care/Admitting Diagnosis ED Disposition    ED Disposition Condition Comment   Admit  Hospital Area: [facility] [[id number]]  Level of Care: Med-Surg [16]  Diagnosis: Viral meningitis [200877]  Admitting Physician: Sharlene Dory [[id number]]  Attending Physician: Sharlene Dory [[id number]]  Estimated length of stay: past midnight tomorrow  Certification:: I certify this patient will need inpatient services for at least 2 midnights  PT Class (Do Not Modify): Inpatient [101]  PT Acc Code (Do Not Modify): Private [1]       B Medical/Surgery History Past Medical History:  Diagnosis Date  . Anxiety   . Asthma   . Depression   . Intractable chronic migraine without aura    neurologist-- dr Shon Millet (receives botox treatment)   Past Surgical History:  Procedure Laterality Date  . biopsy of cervix    . WISDOM TOOTH EXTRACTION       A IV Location/Drains/Wounds Patient Lines/Drains/Airways Status   Active Line/Drains/Airways    Name:   Placement date:   Placement time:   Site:   Days:   Peripheral IV 06/12/18 Left Antecubital   06/12/18    0928    Antecubital   less than 1          Intake/Output Last 24  hours  Intake/Output Summary (Last 24 hours) at 06/12/2018 1648 Last data filed at 06/12/2018 1647 Gross per 24 hour  Intake 964.3 ml  Output -  Net 964.3 ml    Labs/Imaging Results for orders placed or performed during the hospital encounter of 06/12/18 (from the past 48 hour(s))  CBC with Differential     Status: None   Collection Time: 06/12/18  9:27 AM  Result Value Ref Range   WBC 5.6 4.0 - 10.5 K/uL   RBC 4.22 3.87 - 5.11 MIL/uL   Hemoglobin 12.6 12.0 - 15.0 g/dL   HCT 45.8 59.2 - 92.4 %   MCV 92.2 80.0 - 100.0 fL   MCH 29.9 26.0 - 34.0 pg   MCHC 32.4 30.0 - 36.0 g/dL   RDW 46.2 86.3 - 81.7 %   Platelets 309 150 - 400 K/uL   nRBC 0.0 0.0 - 0.2 %   Neutrophils Relative % 57 %   Neutro Abs 3.2 1.7 - 7.7 K/uL   Lymphocytes Relative 35 %   Lymphs Abs 2.0 0.7 - 4.0 K/uL   Monocytes Relative 7 %   Monocytes Absolute 0.4 0.1 - 1.0 K/uL   Eosinophils Relative 1 %   Eosinophils Absolute 0.0 0.0 - 0.5 K/uL   Basophils Relative 0 %   Basophils Absolute 0.0 0.0 - 0.1 K/uL   Immature Granulocytes 0 %   Abs Immature Granulocytes 0.02 0.00 -  0.07 K/uL    Comment: Performed at [facility], [address], [place], [place] [address]  Basic metabolic panel     Status: Abnormal   Collection Time: 06/12/18  9:27 AM  Result Value Ref Range   Sodium 138 135 - 145 mmol/L   Potassium 3.6 3.5 - 5.1 mmol/L   Chloride 107 98 - 111 mmol/L   CO2 24 22 - 32 mmol/L   Glucose, Bld 108 (H) 70 - 99 mg/dL   BUN 7 6 - 20 mg/dL   Creatinine, Ser 6.04 (H) 0.44 - 1.00 mg/dL   Calcium 9.4 8.9 - 54.0 mg/dL   GFR calc non Af Amer >60 >60 mL/min   GFR calc Af Amer >60 >60 mL/min   Anion gap 7 5 - 15    Comment: Performed at [facility], [address] [place], [place] [address]  CSF culture     Status: None (Preliminary result)   Collection Time: 06/12/18 11:45 AM  Result Value Ref Range   Specimen Description CSF    Special Requests Normal    Gram Stain      WBC PRESENT,  PREDOMINANTLY MONONUCLEAR NO ORGANISMS SEEN CYTOSPIN SMEAR Performed at [facility], [address], [place], [place] [address]    Culture PENDING    Report Status PENDING   CSF cell count with differential collection tube #: 1     Status: Abnormal   Collection Time: 06/12/18 11:55 AM  Result Value Ref Range   Tube # 1    Color, CSF COLORLESS COLORLESS   Appearance, CSF CLEAR (A) CLEAR   Supernatant COLORLESS    RBC Count, CSF 45 (H) 0 /cu mm   WBC, CSF 705 (HH) 0 - 5 /cu mm    Comment: CRITICAL RESULT CALLED TO, READ BACK BY AND VERIFIED WITH: RAND, K RN @ 1415 ON 06/12/2018 BY TEMOCHE, H    Segmented Neutrophils-CSF 1 0 - 6 %   Lymphs, CSF 80 40 - 80 %   Monocyte-Macrophage-Spinal Fluid 19 15 - 45 %    Comment: Performed at [facility], [address], [place], [place] [address]  CSF cell count with differential collection tube #: 4     Status: Abnormal   Collection Time: 06/12/18 11:55 AM  Result Value Ref Range   Tube # 4    Color, CSF COLORLESS COLORLESS   Appearance, CSF CLEAR (A) CLEAR   Supernatant COLORLESS    RBC Count, CSF 45 (H) 0 /cu mm   WBC, CSF 820 (HH) 0 - 5 /cu mm    Comment: CRITICAL RESULT CALLED TO, READ BACK BY AND VERIFIED WITH: RAND, K RN @ 1426 ON 06/12/2018 BY TEMOCHE, H    Segmented Neutrophils-CSF 0 0 - 6 %   Lymphs, CSF 92 (H) 40 - 80 %   Monocyte-Macrophage-Spinal Fluid 8 (L) 15 - 45 %    Comment: Performed at [facility], [address] [place], [place] [address]  Protein and glucose, CSF     Status: Abnormal   Collection Time: 06/12/18 11:55 AM  Result Value Ref Range   Glucose, CSF 47 40 - 70 mg/dL   Total  Protein, CSF 87 (H) 15 - 45 mg/dL    Comment: Performed at [facility], [address], [place], [place] [address]  Pregnancy, urine     Status: None   Collection Time: 06/12/18  3:13 PM  Result Value  Ref Range   Preg Test, Ur NEGATIVE NEGATIVE    Comment:        THE SENSITIVITY OF THIS METHODOLOGY IS  >20 mIU/mL. Performed at [facility], [address], [place], [place] [address]   Urinalysis, Routine w reflex microscopic     Status: None   Collection Time: 06/12/18  3:13 PM  Result Value Ref Range   Color, Urine YELLOW YELLOW   APPearance CLEAR CLEAR   Specific Gravity, Urine 1.013 1.005 - 1.030   pH 6.0 5.0 - 8.0   Glucose, UA NEGATIVE NEGATIVE mg/dL   Hgb urine dipstick NEGATIVE NEGATIVE   Bilirubin Urine NEGATIVE NEGATIVE   Ketones, ur NEGATIVE NEGATIVE mg/dL   Protein, ur NEGATIVE NEGATIVE mg/dL   Nitrite NEGATIVE NEGATIVE   Leukocytes,Ua NEGATIVE NEGATIVE    Comment: Performed at [facility], [address], [place], [place] [address]   Ct Head Wo Contrast  Result Date: 06/12/2018 CLINICAL DATA:  Headaches EXAM: CT HEAD WITHOUT CONTRAST TECHNIQUE: Contiguous axial images were obtained from the base of the skull through the vertex without intravenous contrast. COMPARISON:  10/27/2017 FINDINGS: Brain: No evidence of acute infarction, hemorrhage, hydrocephalus, extra-axial collection or mass lesion/mass effect. Vascular: No hyperdense vessel or unexpected calcification. Skull: Normal. Negative for fracture or focal lesion. Sinuses/Orbits: No acute finding. Other: None. IMPRESSION: No acute intracranial abnormality noted. Electronically Signed   By: Alcide Clever M.D.   On: 06/12/2018 10:47    Pending Labs Unresulted Labs (From admission, onward)    Start     Ordered   06/13/18 0500  Basic metabolic panel  Tomorrow morning,   R     06/12/18 1643   06/13/18 0500  CBC  Tomorrow morning,   R     06/12/18 1643   06/12/18 1508  Urine culture  ONCE - STAT,   STAT    Question:  Patient immune status  Answer:  Normal   06/12/18 1507   06/12/18 1102  VDRL, CSF  (Meningitis Panel)  Once,   R     06/12/18 1101   06/12/18 1102  Herpes simplex virus (HSV), DNA by PCR Cerebrospinal Fluid  (Meningitis Panel)  Once,   R     06/12/18 1101   06/12/18 0900  Culture, blood  (routine x 2)  BLOOD CULTURE X 2,   STAT     06/12/18 0859          Vitals/Pain Today's Vitals   06/12/18 1230 06/12/18 1437 06/12/18 1440 06/12/18 1535  BP: 104/63 102/78  124/69  Pulse: 68 68  67  Resp: Temp:      TempSrc:      SpO2: 100% 100%  100%  Weight:      Height:      PainSc:   7      Isolation Precautions Droplet precaution  Medications Medications  acyclovir (ZOVIRAX) 830 mg in dextrose 5 % 150 mL IVPB ( Intravenous Stopped 06/12/18 1642)  albuterol (PROVENTIL HFA;VENTOLIN HFA) 108 (90 Base) MCG/ACT inhaler 1-2 puff (has no administration in time range)  acetaminophen (TYLENOL) tablet 1,000 mg (has no administration in time range)  beclomethasone (QVAR) 40 MCG/ACT inhaler 2 puff (has no administration in time range)  escitalopram (LEXAPRO) tablet 10 mg (has no administration in time range)  montelukast (SINGULAIR) tablet 10 mg (has no administration in time range)  zolmitriptan (ZOMIG) nasal solution 1 spray (has no administration in time range)  oxyCODONE (  Oxy IR/ROXICODONE) immediate release tablet 5 mg (has no administration in time range)  traZODone (DESYREL) tablet 50 mg (has no administration in time range)  polyethylene glycol (MIRALAX / GLYCOLAX) packet 17 g (has no administration in time range)  ondansetron (ZOFRAN) tablet 4 mg (has no administration in time range)    Or  ondansetron (ZOFRAN) injection 4 mg (has no administration in time range)  morphine 2 MG/ML injection 1-3 mg (has no administration in time range)  lactated ringers infusion (has no administration in time range)  sodium chloride 0.9 % bolus 500 mL (0 mLs Intravenous Stopped 06/12/18 1120)  diazepam (VALIUM) tablet 5 mg (5 mg Oral Given 06/12/18 0943)  lidocaine (PF) (XYLOCAINE) 1 % injection 10 mL (10 mLs Infiltration Given by Other 06/12/18 1200)  droperidol (INAPSINE) 2.5 MG/ML injection 1.25 mg (1.25 mg Intravenous Given 06/12/18 1242)  dexamethasone (DECADRON) injection 10 mg  (10 mg Intravenous Given 06/12/18 1435)    Mobility walks Low fall risk    R Recommendations: See Admitting Provider Note  Report given to:   Additional Notes:

## 2018-06-12 NOTE — ED Notes (Signed)
Patient transported to CT 

## 2018-06-12 NOTE — ED Provider Notes (Signed)
MSE was initiated and I personally evaluated the patient and placed orders (if any) at  9:00 AM on June 12, 2018.   Kayla Owens is a 28 y.o. female who presents to ED for headache x 3 days. She does have hx of migraines, but reports this feels worse. Seen by neurology yesterday where she received Toradol and Botox injection with little improvement.   Gen: afebrile, VSS HEENT: Atraumatic, EOMI Resp: no resp distress CV: RRR Abd: soft, NT, ND MsK: moving all extremities well Neuro: A&O x4; CN 2-12 grossly intact.  Discussed with attending, Dr. Jacqulyn Bath, and we believe patient needs work up best suited for higher acuity area of the Emergency Department. The patient appears stable so that the remainder of the MSE may be completed by another provider.   , Chase Picket, PA-C 06/12/18 0902    Maia Plan, MD 06/12/18 [name]

## 2018-06-12 NOTE — ED Provider Notes (Signed)
Emergency Department Provider Note   I have reviewed the triage vital signs and the nursing notes.   HISTORY  Chief Complaint Headache   HPI Kayla Owens is a 28 y.o. female with PMH of asthma and migraine HA presents to the emergency department for evaluation of a typical headache for her, neck discomfort, and subjective fevers at home.  Symptoms have been ongoing for the past 4 days.  Patient initially thought this could have been a migraine headache and saw her neurologist who gave her Toradol and Botox injections.  This therapy typically improves her headaches dramatically but has had no effect on this headache.  She has developed associated neck discomfort.  Patient has not recorded any fevers at home but has felt chills and sweats.  When she checks her temp it is normal but she feels she may have had some fevers.  She notes some photophobia, typical of her migraines, but the headache quality is different.  No sudden onset, maximal intensity headache symptoms.  No weakness or numbness.  No confusion. No vomiting.   Past Medical History:  Diagnosis Date  . Anxiety   . Asthma   . Depression   . Intractable chronic migraine without aura    neurologist-- dr Shon Millet (receives botox treatment)    Patient Active Problem List   Diagnosis Date Noted  . Viral meningitis 06/12/2018  . Class 2 obesity due to excess calories without serious comorbidity with body mass index (BMI) of 38.0 to 38.9 in adult 11/04/2016  . Migraine with aura and without status migrainosus, not intractable 10/03/2016  . Moderate persistent asthma 10/03/2016  . Anxiety and depression 10/03/2016    Past Surgical History:  Procedure Laterality Date  . biopsy of cervix    . WISDOM TOOTH EXTRACTION     Allergies Patient has no known allergies.  Family History  Problem Relation Age of Onset  . Arthritis Mother   . Hypertension Mother   . Lupus Mother   . Heart disease Maternal Grandmother   .  Heart disease Maternal Grandfather   . Hypertension Maternal Grandfather   . Arthritis Paternal Grandmother   . Stomach cancer Paternal Grandfather   . Ovarian cancer Maternal Aunt   . Ovarian cancer Maternal Aunt     Social History Social History   Tobacco Use  . Smoking status: Current Every Day Smoker    Types: Cigarettes  . Smokeless tobacco: Never Used  . Tobacco comment: smokes black and mild cigars QOD, patient states two a day  Substance Use Topics  . Alcohol use: No  . Drug use: No    Review of Systems  Constitutional: No fever/chills Eyes: No visual changes. Positive photophobia.  ENT: No sore throat. Cardiovascular: Denies chest pain. Respiratory: Denies shortness of breath. Gastrointestinal: No abdominal pain.  No nausea, no vomiting.  No diarrhea.  No constipation. Genitourinary: Negative for dysuria. Musculoskeletal: Negative for back pain. Positive neck pain.  Skin: Negative for rash. Neurological: Negative for focal weakness or numbness. Positive HA.   10-point ROS otherwise negative.  ____________________________________________   PHYSICAL EXAM:  VITAL SIGNS: ED Triage Vitals  Enc Vitals Group     BP 06/12/18 0837 121/78     Pulse Rate 06/12/18 0837 83     Resp 06/12/18 0837 20     Temp 06/12/18 0837 98.6 F (37 C)     Temp Source 06/12/18 0837 Oral     SpO2 06/12/18 0837 100 %  Pain Score 06/12/18 0836 10   Constitutional: Alert and oriented. Well appearing and in no acute distress. Eyes: Conjunctivae are normal. Head: Atraumatic. Nose: No congestion/rhinnorhea. Mouth/Throat: Mucous membranes are moist.  Oropharynx non-erythematous. Neck: No stridor. Limited ROM of the neck secondary to pain.  Cardiovascular: Normal rate, regular rhythm. Good peripheral circulation. Grossly normal heart sounds.   Respiratory: Normal respiratory effort.  No retractions. Lungs CTAB. Gastrointestinal: Soft and nontender. No distention.  Musculoskeletal:  No lower extremity tenderness nor edema. No gross deformities of extremities. Neurologic:  Normal speech and language. No gross focal neurologic deficits are appreciated.  Skin:  Skin is warm, dry and intact. No rash noted.   ____________________________________________   LABS (all labs ordered are listed, but only abnormal results are displayed)  Labs Reviewed  BASIC METABOLIC PANEL - Abnormal; Notable for the following components:      Result Value   Glucose, Bld 108 (*)    Creatinine, Ser 1.03 (*)    All other components within normal limits  CSF CELL COUNT WITH DIFFERENTIAL - Abnormal; Notable for the following components:   Appearance, CSF CLEAR (*)    RBC Count, CSF 45 (*)    WBC, CSF 705 (*)    All other components within normal limits  CSF CELL COUNT WITH DIFFERENTIAL - Abnormal; Notable for the following components:   Appearance, CSF CLEAR (*)    RBC Count, CSF 45 (*)    WBC, CSF 820 (*)    Lymphs, CSF 92 (*)    Monocyte-Macrophage-Spinal Fluid 8 (*)    All other components within normal limits  PROTEIN AND GLUCOSE, CSF - Abnormal; Notable for the following components:   Total  Protein, CSF 87 (*)    All other components within normal limits  CULTURE, BLOOD (ROUTINE X 2)  CULTURE, BLOOD (ROUTINE X 2)  CSF CULTURE  URINE CULTURE  CBC WITH DIFFERENTIAL/PLATELET  PREGNANCY, URINE  URINALYSIS, ROUTINE W REFLEX MICROSCOPIC  VDRL, CSF  HERPES SIMPLEX VIRUS(HSV) DNA BY PCR  BASIC METABOLIC PANEL  CBC   ____________________________________________  RADIOLOGY  Ct Head Wo Contrast  Result Date: 06/12/2018 CLINICAL DATA:  Headaches EXAM: CT HEAD WITHOUT CONTRAST TECHNIQUE: Contiguous axial images were obtained from the base of the skull through the vertex without intravenous contrast. COMPARISON:  10/27/2017 FINDINGS: Brain: No evidence of acute infarction, hemorrhage, hydrocephalus, extra-axial collection or mass lesion/mass effect. Vascular: No hyperdense vessel or  unexpected calcification. Skull: Normal. Negative for fracture or focal lesion. Sinuses/Orbits: No acute finding. Other: None. IMPRESSION: No acute intracranial abnormality noted. Electronically Signed   By: Alcide Clever M.D.   On: 06/12/2018 10:47                                                                                                         EKG:    EKG Interpretation  Date/Time:  Saturday June 12 2018 12:15:29 EST Ventricular Rate:  67 PR Interval:    QRS Duration: 78 QT Interval:  361 QTC Calculation: 381 R Axis:   66 Text Interpretation:  Sinus rhythm  No STEMI. Normal QTc.  Confirmed by Alona Bene [phone]) on 06/12/2018 12:18:15 PM       ____________________________________________   PROCEDURES  Procedure(s) performed:   .Lumbar Puncture Date/Time: 06/12/2018 11:49 AM Performed by: Maia Plan, MD Authorized by: Maia Plan, MD   Consent:    Consent obtained:  Written   Consent given by:  Patient   Risks discussed:  Bleeding, headache, infection, nerve damage, pain and repeat procedure   Alternatives discussed:  No treatment Pre-procedure details:    Procedure purpose:  Diagnostic   Preparation: Patient was prepped and draped in usual sterile fashion   Anesthesia (see MAR for exact dosages):    Anesthesia method:  Local infiltration   Local anesthetic:  Lidocaine 1% w/o epi Procedure details:    Lumbar space:  L4-L5 interspace   Patient position:  Sitting   Needle gauge:  18   Needle type:  Diamond point   Needle length (in):  5.0   Ultrasound guidance: no     Number of attempts:  2   Fluid appearance:  Clear   Tubes of fluid:  4   Total volume (ml):  7 Post-procedure:    Puncture site:  Adhesive bandage applied and direct pressure applied   Patient tolerance of procedure:  Tolerated well, no immediate complications     ____________________________________________   INITIAL IMPRESSION / ASSESSMENT AND PLAN / ED COURSE  Pertinent labs &  imaging results that were available during my care of the patient were reviewed by me and considered in my medical decision making (see chart for details).  Patient presents to the emergency department for evaluation of headache, neck discomfort, subjective fevers at home.  She is afebrile here.  No focal neurologic deficits.  Patient does have photophobia.  Standard migraine treatments have not improved patient's headache symptoms and states that this feels different from her migraines.  Meningitis is a possibility but lower on my differential.  I do plan for screening labs, CT head, and reassess after muscle relaxation.   12:10 PM CT imaging along with baseline lab work reviewed.  No leukocytosis or other acute findings.  Patient continues to have significant headache.  I discussed the risks and benefits of lumbar puncture and the patient has consented to proceed with the procedure.  Written consent obtained.  Procedure performed as above without complication.  Had to reposition the patient to the sitting position so was unable to obtain an opening pressure.  Fluid was clear.  Results pending.  Obtain baseline EKG to assess QTC which is normal.  Patient will be given droperidol for headache management pending CSF labs.   02:30 PM Likely viral meningitis on CSF white count is high.  Patient feeling better terms of headache after droperidol but means fatigue.  She is not confused.  I discussed the case with Dr. Luciana Axe who feels we could skip the acyclovir with no confusion and no clinical concern for encephalitis.  Given her overall appearance I did elect to give initial antibiotic dosing for meningitis but cultures obtained and can follow.  Plan for observational admission.  Discussed this with the patient who is in agreement.  Discussed patient's case with Hospitlaist to request admission. Patient and family (if present) updated with plan. Care transferred to Hospitalist service.  I reviewed all  nursing notes, vitals, pertinent old records, EKGs, labs, imaging (as available).  ____________________________________________  FINAL CLINICAL IMPRESSION(S) / ED DIAGNOSES  Final diagnoses:  Viral meningitis, unspecified  Acute nonintractable  headache, unspecified headache type     MEDICATIONS GIVEN DURING THIS VISIT:  Medications  acyclovir (ZOVIRAX) 830 mg in dextrose 5 % 150 mL IVPB ( Intravenous Stopped 06/12/18 1642)  albuterol (PROVENTIL) (2.5 MG/3ML) 0.083% nebulizer solution 2.5 mg (has no administration in time range)  acetaminophen (TYLENOL) tablet 1,000 mg (has no administration in time range)  escitalopram (LEXAPRO) tablet 10 mg (has no administration in time range)  montelukast (SINGULAIR) tablet 10 mg (has no administration in time range)  oxyCODONE (Oxy IR/ROXICODONE) immediate release tablet 5 mg (has no administration in time range)  traZODone (DESYREL) tablet 50 mg (has no administration in time range)  polyethylene glycol (MIRALAX / GLYCOLAX) packet 17 g (has no administration in time range)  ondansetron (ZOFRAN) tablet 4 mg (has no administration in time range)    Or  ondansetron (ZOFRAN) injection 4 mg (has no administration in time range)  morphine 2 MG/ML injection 1-3 mg (has no administration in time range)  lactated ringers infusion ( Intravenous New Bag/Given 06/12/18 1701)  budesonide (PULMICORT) nebulizer solution 0.25 mg (has no administration in time range)  SUMAtriptan (IMITREX) tablet 50 mg (has no administration in time range)  sodium chloride 0.9 % bolus 500 mL (0 mLs Intravenous Stopped 06/12/18 1120)  diazepam (VALIUM) tablet 5 mg (5 mg Oral Given 06/12/18 0943)  lidocaine (PF) (XYLOCAINE) 1 % injection 10 mL (10 mLs Infiltration Given by Other 06/12/18 1200)  droperidol (INAPSINE) 2.5 MG/ML injection 1.25 mg (1.25 mg Intravenous Given 06/12/18 1242)  dexamethasone (DECADRON) injection 10 mg (10 mg Intravenous Given 06/12/18 1435)    Note:  This document  was prepared using Dragon voice recognition software and may include unintentional dictation errors.  Alona Bene, MD Emergency Medicine    Long, Arlyss Repress, MD 06/12/18 [phone]

## 2018-06-13 DIAGNOSIS — G43109 Migraine with aura, not intractable, without status migrainosus: Principal | ICD-10-CM

## 2018-06-13 DIAGNOSIS — J4540 Moderate persistent asthma, uncomplicated: Secondary | ICD-10-CM

## 2018-06-13 DIAGNOSIS — A879 Viral meningitis, unspecified: Secondary | ICD-10-CM

## 2018-06-13 LAB — CBC
HCT: 35.2 % — ABNORMAL LOW (ref 36.0–46.0)
Hemoglobin: 11.3 g/dL — ABNORMAL LOW (ref 12.0–15.0)
MCH: 28.9 pg (ref 26.0–34.0)
MCHC: 32.1 g/dL (ref 30.0–36.0)
MCV: 90 fL (ref 80.0–100.0)
Platelets: 295 10*3/uL (ref 150–400)
RBC: 3.91 MIL/uL (ref 3.87–5.11)
RDW: 13.2 % (ref 11.5–15.5)
WBC: 9 10*3/uL (ref 4.0–10.5)
nRBC: 0 % (ref 0.0–0.2)

## 2018-06-13 LAB — BASIC METABOLIC PANEL
Anion gap: 9 (ref 5–15)
BUN: 6 mg/dL (ref 6–20)
CO2: 22 mmol/L (ref 22–32)
Calcium: 9.1 mg/dL (ref 8.9–10.3)
Chloride: 107 mmol/L (ref 98–111)
Creatinine, Ser: 0.9 mg/dL (ref 0.44–1.00)
GFR calc Af Amer: >60 mL/min (ref >60)
GFR calc non Af Amer: >60 mL/min (ref >60)
Glucose, Bld: 122 mg/dL — ABNORMAL HIGH (ref 70–99)
Potassium: 3.7 mmol/L (ref 3.5–5.1)
Sodium: 138 mmol/L (ref 135–145)

## 2018-06-13 LAB — URINE CULTURE: Special Requests: NORMAL

## 2018-06-13 MED ORDER — ENOXAPARIN SODIUM 40 MG/0.4ML ~~LOC~~ SOLN
40.0000 mg | SUBCUTANEOUS | Status: DC
  Administered 2018-06-13: 40 mg via SUBCUTANEOUS
  Filled 2018-06-13: qty 0.4

## 2018-06-13 MED ORDER — KETOROLAC TROMETHAMINE 30 MG/ML IJ SOLN
30.0000 mg | Freq: Three times a day (TID) | INTRAMUSCULAR | Status: DC | PRN
  Administered 2018-06-13: 30 mg via INTRAVENOUS
  Filled 2018-06-13: qty 1

## 2018-06-13 NOTE — Progress Notes (Addendum)
PROGRESS NOTE        PATIENT DETAILS Name: Kayla Owens Age: 28 y.o. Sex: female Date of Birth: [birth year] Admit Date: 06/12/2018 Admitting Physician Sharlene Dory, DO QMV:[id number], Audie Box, NP  Brief Narrative: Patient is a 28 y.o. female with history of migraine headaches-presenting with headache and fever, found to have possible viral meningitis and admitted to the hospitalist service.  See below for further details  Subjective: Headache is markedly better-down to 3/10 this morning.  Current headaches are different from migraine flares.  Nausea vomiting.  Ambulating in the room when I walked in.  Assessment/Plan: Meningitis: CSF with predominantly lymphocytosis-overall clinically improved-CSF cultures pending, remains on empiric IV acyclovir.  VDRL CSF pending as well.  Continue IV acyclovir-await further CSF studies.  Addendum: Spoke with infectious disease-Dr. Comer-since patient is not encephalopathic-does not require IV acyclovir.  Migraine headaches: Current headaches different from her usual migraine flares, supportive care-hopefully will continue to improve with improvement in underlying meningitis.  Moderate persistent asthma: No evidence of flare-continue bronchodilators  DVT Prophylaxis: Prophylactic Lovenox  Code Status: Full code  Family Communication: None at bedside  Disposition Plan: Remain inpatient  Antimicrobial agents: Anti-infectives (From admission, onward)   Start     Dose/Rate Route Frequency Ordered Stop   06/13/18 0100  vancomycin (VANCOCIN) IVPB 1000 mg/200 mL premix  Status:  Discontinued     1,000 mg 200 mL/hr over 60 Minutes Intravenous Every 8 hours 06/12/18 1506 06/12/18 1637   06/12/18 1515  acyclovir (ZOVIRAX) 830 mg in dextrose 5 % 150 mL IVPB     830 mg 166.6 mL/hr over 60 Minutes Intravenous Every 8 hours 06/12/18 1506     06/12/18 1515  vancomycin (VANCOCIN) IVPB 1000 mg/200 mL premix   Status:  Discontinued     1,000 mg 200 mL/hr over 60 Minutes Intravenous  Once 06/12/18 1506 06/12/18 1637   06/12/18 1430  cefTRIAXone (ROCEPHIN) 2 g in sodium chloride 0.9 % 100 mL IVPB  Status:  Discontinued     2 g 200 mL/hr over 30 Minutes Intravenous Every 12 hours 06/12/18 1421 06/12/18 1637   06/12/18 1430  vancomycin (VANCOCIN) IVPB 1000 mg/200 mL premix  Status:  Discontinued     1,000 mg 200 mL/hr over 60 Minutes Intravenous  Once 06/12/18 1421 06/12/18 1644   06/12/18 1430  acyclovir (ZOVIRAX) 1,135 mg in dextrose 5 % 250 mL IVPB  Status:  Discontinued     10 mg/kg  113.4 kg 272.7 mL/hr over 60 Minutes Intravenous  Once 06/12/18 1421 06/12/18 1435      Procedures: 3/7>> LP in the emergency room  CONSULTS:  None  Time spent: 25- minutes-Greater than 50% of this time was spent in counseling, explanation of diagnosis, planning of further management, and coordination of care.  MEDICATIONS: Scheduled Meds: . budesonide (PULMICORT) nebulizer solution  0.25 mg Nebulization BID  . escitalopram  10 mg Oral QHS  . montelukast  10 mg Oral QHS   Continuous Infusions: . acyclovir 830 mg (06/13/18 0637)  . lactated ringers 75 mL/hr at 06/12/18 2153   PRN Meds:.acetaminophen, albuterol, morphine injection, ondansetron **OR** ondansetron (ZOFRAN) IV, oxyCODONE, polyethylene glycol, SUMAtriptan, traZODone   PHYSICAL EXAM: Vital signs: Vitals:   06/12/18 2033 06/12/18 2157 06/13/18 0425 06/13/18 0722  BP:  108/60 (!) 102/54   Pulse:  81 68 72  Resp:  20 16 18   Temp:  98 F (36.7 C) 98.6 F (37 C)   TempSrc:  Oral Oral   SpO2: 98% 99% 100% 100%  Weight:      Height:       Filed Weights   06/12/18 0945  Weight: 113.4 kg   Body mass index is 38.58 kg/m.   General appearance :Awake, alert, not in any distress. Speech Clear. Not toxic Looking Eyes:, pupils equally reactive to light and accomodation,no scleral icterus.Pink conjunctiva HEENT: Atraumatic and  Normocephalic Neck: supple, no JVD. No cervical lymphadenopathy. No thyromegaly Resp:Good air entry bilaterally, no added sounds  CVS: S1 S2 regular, no murmurs.  GI: Bowel sounds present, Non tender and not distended with no gaurding, rigidity or rebound.No organomegaly Extremities: B/L Lower Ext shows no edema, both legs are warm to touch Neurology:  speech clear,Non focal, sensation is grossly intact. Psychiatric: Normal judgment and insight. Alert and oriented x 3. Normal mood. Musculoskeletal:No digital cyanosis Skin:No Rash, warm and dry Wounds:N/A  I have personally reviewed following labs and imaging studies  LABORATORY DATA: CBC: Recent Labs  Lab 06/12/18 0927  WBC 5.6  NEUTROABS 3.2  HGB 12.6  HCT 38.9  MCV 92.2  PLT 309    Basic Metabolic Panel: Recent Labs  Lab 06/12/18 0927 06/13/18 0753  NA 138 138  K 3.6 3.7  CL 107 107  CO2 24 22  GLUCOSE 108* 122*  BUN 7 6  CREATININE 1.03* 0.90  CALCIUM 9.4 9.1    GFR: Estimated Creatinine Clearance: 121.9 mL/min (by C-G formula based on SCr of 0.9 mg/dL).  Liver Function Tests: No results for input(s): AST, ALT, ALKPHOS, BILITOT, PROT, ALBUMIN in the last 168 hours. No results for input(s): LIPASE, AMYLASE in the last 168 hours. No results for input(s): AMMONIA in the last 168 hours.  Coagulation Profile: No results for input(s): INR, PROTIME in the last 168 hours.  Cardiac Enzymes: No results for input(s): CKTOTAL, CKMB, CKMBINDEX, TROPONINI in the last 168 hours.  BNP (last 3 results) No results for input(s): PROBNP in the last 8760 hours.  HbA1C: No results for input(s): HGBA1C in the last 72 hours.  CBG: No results for input(s): GLUCAP in the last 168 hours.  Lipid Profile: No results for input(s): CHOL, HDL, LDLCALC, TRIG, CHOLHDL, LDLDIRECT in the last 72 hours.  Thyroid Function Tests: No results for input(s): TSH, T4TOTAL, FREET4, T3FREE, THYROIDAB in the last 72 hours.  Anemia  Panel: No results for input(s): VITAMINB12, FOLATE, FERRITIN, TIBC, IRON, RETICCTPCT in the last 72 hours.  Urine analysis:    Component Value Date/Time   COLORURINE YELLOW 06/12/2018 1513   APPEARANCEUR CLEAR 06/12/2018 1513   LABSPEC 1.013 06/12/2018 1513   PHURINE 6.0 06/12/2018 1513   GLUCOSEU NEGATIVE 06/12/2018 1513   HGBUR NEGATIVE 06/12/2018 1513   BILIRUBINUR NEGATIVE 06/12/2018 1513   KETONESUR NEGATIVE 06/12/2018 1513   PROTEINUR NEGATIVE 06/12/2018 1513   UROBILINOGEN 0.2 04/23/2014 0315   NITRITE NEGATIVE 06/12/2018 1513   LEUKOCYTESUR NEGATIVE 06/12/2018 1513    Sepsis Labs: Lactic Acid, Venous No results found for: LATICACIDVEN  MICROBIOLOGY: Recent Results (from the past 240 hour(s))  Culture, blood (routine x 2)     Status: None (Preliminary result)   Collection Time: 06/12/18  9:28 AM  Result Value Ref Range Status   Specimen Description BLOOD RIGHT ANTECUBITAL  Final   Special Requests   Final    BOTTLES DRAWN AEROBIC AND ANAEROBIC Blood Culture results may not be  optimal due to an excessive volume of blood received in culture bottles   Culture   Final    NO GROWTH < 24 HOURS Performed at [facility], [address] [place], [place] [address]    Report Status PENDING  Incomplete  Culture, blood (routine x 2)     Status: None (Preliminary result)   Collection Time: 06/12/18  9:31 AM  Result Value Ref Range Status   Specimen Description BLOOD LEFT ANTECUBITAL  Final   Special Requests   Final    BOTTLES DRAWN AEROBIC AND ANAEROBIC Blood Culture adequate volume   Culture   Final    NO GROWTH < 24 HOURS Performed at [facility], [address] [place], [place] [address]    Report Status PENDING  Incomplete  CSF culture     Status: None (Preliminary result)   Collection Time: 06/12/18 11:45 AM  Result Value Ref Range Status   Specimen Description CSF  Final   Special Requests Normal  Final   Gram Stain   Final    WBC PRESENT,  PREDOMINANTLY MONONUCLEAR NO ORGANISMS SEEN CYTOSPIN SMEAR Performed at [facility], [address], [place], [place] [address]    Culture PENDING  Incomplete   Report Status PENDING  Incomplete    RADIOLOGY STUDIES/RESULTS: Ct Head Wo Contrast  Result Date: 06/12/2018 CLINICAL DATA:  Headaches EXAM: CT HEAD WITHOUT CONTRAST TECHNIQUE: Contiguous axial images were obtained from the base of the skull through the vertex without intravenous contrast. COMPARISON:  10/27/2017 FINDINGS: Brain: No evidence of acute infarction, hemorrhage, hydrocephalus, extra-axial collection or mass lesion/mass effect. Vascular: No hyperdense vessel or unexpected calcification. Skull: Normal. Negative for fracture or focal lesion. Sinuses/Orbits: No acute finding. Other: None. IMPRESSION: No acute intracranial abnormality noted. Electronically Signed   By: Alcide Clever M.D.   On: 06/12/2018 10:47     LOS: 1 day   Jeoffrey Massed, MD  [facility]  If 7PM-7AM, please contact night-coverage  Please page via [email or web address]  Go to [email or web address] and use [facility]'s universal password to access. If you do not have the password, please contact the hospital operator.  Locate the [facility] provider you are looking for under [facility] and page to a number that you can be directly reached. If you still have difficulty reaching the provider, please page the [facility] (Director on Call) for the Hospitalists listed on amion for assistance.  06/13/2018, 9:24 AM

## 2018-06-14 LAB — HERPES SIMPLEX VIRUS(HSV) DNA BY PCR
HSV 1 DNA: NEGATIVE
HSV 2 DNA: POSITIVE — AB

## 2018-06-14 LAB — HSV DNA BY PCR (REFERENCE LAB)

## 2018-06-14 LAB — VDRL, CSF: VDRL Quant, CSF: NONREACTIVE

## 2018-06-14 NOTE — Discharge Summary (Signed)
PATIENT DETAILS Name: Kayla Owens Age: 28 y.o. Sex: female Date of Birth: May 18, 1990 MRN: [id number]. Admitting Physician: Sharlene Dory, DO UMP:[id number], Audie Box, NP  Admit Date: 06/12/2018 Discharge date: 06/14/2018  Recommendations for Outpatient Follow-up:  1. Follow up with PCP in 1-2 weeks 2. Please obtain BMP/CBC in one week 3. Please follow CSF HSV PCR, CSF VDRL  Admitted From:  Home  Disposition: Home   Home Health: No  Equipment/Devices: None  Discharge Condition: Stable  CODE STATUS: FULL CODE  Diet recommendation:  Regular  Brief Summary: See H&P, Labs, Consult and Test reports for all details in brief, Patient is a 28 y.o. female with history of migraine headaches-presenting with headache and fever, found to have possible viral meningitis and admitted to the hospitalist service.  See below for further details  Brief Hospital Course: Meningitis: CSF with predominantly lymphocytosis-IV suspicious for viral meningitis.  CSF cultures negative.  Discussed with infectious disease-no specific recommendations-acyclovir was stopped-and was monitored overnight-she continues to do well.  Completely headache free-afebrile-suspect can be discharged home.  VDRL CSF and CSF HSV PCR pending-PCP to follow.  Migraine headaches: Headache free at the time of discharge.  Feels much better-continue usual migraine medications.    Moderate persistent asthma: No evidence of flare-continue bronchodilators  Procedures/Studies: 3/7>> LP in the emergency room  Discharge Diagnoses:  Principal Problem:   Viral meningitis Active Problems:   Migraine with aura and without status migrainosus, not intractable   Moderate persistent asthma   Discharge Instructions:  Activity:  As tolerated  Discharge Instructions    Diet general   Complete by:  As directed    Discharge instructions   Complete by:  As directed    Follow with Primary MD  Evaristo Bury, NP in the next 2-3 days  CSF HSV PCR and CSF VDRL pending-please ask your primary care practitioner to follow  Please get a complete blood count and chemistry panel checked by your Primary MD at your next visit, and again as instructed by your Primary MD.  Get Medicines reviewed and adjusted: Please take all your medications with you for your next visit with your Primary MD  Laboratory/radiological data: Please request your Primary MD to go over all hospital tests and procedure/radiological results at the follow up, please ask your Primary MD to get all Hospital records sent to his/her office.  In some cases, they will be blood work, cultures and biopsy results pending at the time of your discharge. Please request that your primary care M.D. follows up on these results.  Also Note the following: If you experience worsening of your admission symptoms, develop shortness of breath, life threatening emergency, suicidal or homicidal thoughts you must seek medical attention immediately by calling 911 or calling your MD immediately  if symptoms less severe.  You must read complete instructions/literature along with all the possible adverse reactions/side effects for all the Medicines you take and that have been prescribed to you. Take any new Medicines after you have completely understood and accpet all the possible adverse reactions/side effects.   Do not drive when taking Pain medications or sleeping medications (Benzodaizepines)  Do not take more than prescribed Pain, Sleep and Anxiety Medications. It is not advisable to combine anxiety,sleep and pain medications without talking with your primary care practitioner  Special Instructions: If you have smoked or chewed Tobacco  in the last 2 yrs please stop smoking, stop any regular Alcohol  and or any Recreational drug use.  Wear Seat belts while driving.  Please note: You were cared for by a hospitalist during your hospital stay.  Once you are discharged, your primary care physician will handle any further medical issues. Please note that NO REFILLS for any discharge medications will be authorized once you are discharged, as it is imperative that you return to your primary care physician (or establish a relationship with a primary care physician if you do not have one) for your post hospital discharge needs so that they can reassess your need for medications and monitor your lab values.   Increase activity slowly   Complete by:  As directed      Allergies as of 06/14/2018   No Known Allergies     Medication List    TAKE these medications   acetaminophen 500 MG tablet Commonly known as:  TYLENOL Take 1,000 mg by mouth every 6 (six) hours as needed for mild pain or headache.   albuterol 108 (90 Base) MCG/ACT inhaler Commonly known as:  PROVENTIL HFA;VENTOLIN HFA Inhale 1-2 puffs into the lungs every 6 (six) hours as needed for wheezing or shortness of breath.   aspirin-acetaminophen-caffeine 250-250-65 MG tablet Commonly known as:  EXCEDRIN MIGRAINE Take 2 tablets by mouth every 6 (six) hours as needed for headache or migraine.   beclomethasone 40 MCG/ACT inhaler Commonly known as:  QVAR Inhale 2 puffs into the lungs daily. What changed:  when to take this   Black Currant Seed Oil 500 MG Caps Take 500 capsules by mouth daily.   BOTOX IJ Inject 1 each as directed every 3 (three) months.   cholecalciferol 25 MCG (1000 UT) tablet Commonly known as:  VITAMIN D3 Take 1,000 Units by mouth daily.   escitalopram 10 MG tablet Commonly known as:  LEXAPRO Take 10 mg by mouth at bedtime.   Fish Oil 1000 MG Caps Take 1,000 mg by mouth daily.   ibuprofen 600 MG tablet Commonly known as:  ADVIL,MOTRIN Take 1 tablet (600 mg total) by mouth every 6 (six) hours as needed for moderate pain. For AFTER surgery   montelukast 10 MG tablet Commonly known as:  SINGULAIR Take 1 tablet (10 mg total) by mouth at bedtime.    zolmitriptan 5 MG nasal solution Commonly known as:  Zomig 1 spray in one nostril at earliest onset of migraine.  May repeat dose x1 in 2h What changed:    how much to take  how to take this  when to take this  reasons to take this  additional instructions      Follow-up Information    Evaristo Bury, NP. Schedule an appointment as soon as possible for a visit in 1 week(s).   Specialty:  Nurse Practitioner Contact information: [address] [place] [address] [phone]          No Known Allergies  Consultations:   None   Other Procedures/Studies: Ct Head Wo Contrast  Result Date: 06/12/2018 CLINICAL DATA:  Headaches EXAM: CT HEAD WITHOUT CONTRAST TECHNIQUE: Contiguous axial images were obtained from the base of the skull through the vertex without intravenous contrast. COMPARISON:  10/27/2017 FINDINGS: Brain: No evidence of acute infarction, hemorrhage, hydrocephalus, extra-axial collection or mass lesion/mass effect. Vascular: No hyperdense vessel or unexpected calcification. Skull: Normal. Negative for fracture or focal lesion. Sinuses/Orbits: No acute finding. Other: None. IMPRESSION: No acute intracranial abnormality noted. Electronically Signed   By: Alcide Clever M.D.   On: 06/12/2018 10:47      TODAY-DAY OF  DISCHARGE:  Subjective:   Kayla Owens today has no headache,no chest abdominal pain,no new weakness tingling or numbness, feels much better wants to go home today.   Objective:   Blood pressure 95/63, pulse 66, temperature 98.1 F (36.7 C), resp. rate 16, height 5' 7.5" (1.715 m), weight 113.4 kg, SpO2 99 %.  Intake/Output Summary (Last 24 hours) at 06/14/2018 0910 Last data filed at 06/13/2018 1800 Gross per 24 hour  Intake 499.4 ml  Output -  Net 499.4 ml   Filed Weights   06/12/18 0945  Weight: 113.4 kg    Exam: Awake Alert, Oriented *3, No new F.N deficits, Normal affect [place].AT,PERRAL Supple Neck,No JVD, No cervical  lymphadenopathy appriciated.  Symmetrical Chest wall movement, Good air movement bilaterally, CTAB RRR,No Gallops,Rubs or new Murmurs, No Parasternal Heave +ve B.Sounds, Abd Soft, Non tender, No organomegaly appriciated, No rebound -guarding or rigidity. No Cyanosis, Clubbing or edema, No new Rash or bruise   PERTINENT RADIOLOGIC STUDIES: Ct Head Wo Contrast  Result Date: 06/12/2018 CLINICAL DATA:  Headaches EXAM: CT HEAD WITHOUT CONTRAST TECHNIQUE: Contiguous axial images were obtained from the base of the skull through the vertex without intravenous contrast. COMPARISON:  10/27/2017 FINDINGS: Brain: No evidence of acute infarction, hemorrhage, hydrocephalus, extra-axial collection or mass lesion/mass effect. Vascular: No hyperdense vessel or unexpected calcification. Skull: Normal. Negative for fracture or focal lesion. Sinuses/Orbits: No acute finding. Other: None. IMPRESSION: No acute intracranial abnormality noted. Electronically Signed   By: Alcide Clever M.D.   On: 06/12/2018 10:47     PERTINENT LAB RESULTS: CBC: Recent Labs    06/12/18 0927 06/13/18 0753  WBC 5.6 9.0  HGB 12.6 11.3*  HCT 38.9 35.2*  PLT 309 295   CMET CMP     Component Value Date/Time   NA 138 06/13/2018 0753   K 3.7 06/13/2018 0753   CL 107 06/13/2018 0753   CO2 22 06/13/2018 0753   GLUCOSE 122 (H) 06/13/2018 0753   BUN 6 06/13/2018 0753   CREATININE 0.90 06/13/2018 0753   CALCIUM 9.1 06/13/2018 0753   PROT 7.6 10/21/2017 1736   ALBUMIN 4.0 10/21/2017 1736   AST 16 10/21/2017 1736   ALT 14 10/21/2017 1736   ALKPHOS 45 10/21/2017 1736   BILITOT 0.6 10/21/2017 1736   GFRNONAA >60 06/13/2018 0753   GFRAA >60 06/13/2018 0753    GFR Estimated Creatinine Clearance: 121.9 mL/min (by C-G formula based on SCr of 0.9 mg/dL). No results for input(s): LIPASE, AMYLASE in the last 72 hours. No results for input(s): CKTOTAL, CKMB, CKMBINDEX, TROPONINI in the last 72 hours. Invalid input(s): POCBNP No  results for input(s): DDIMER in the last 72 hours. No results for input(s): HGBA1C in the last 72 hours. No results for input(s): CHOL, HDL, LDLCALC, TRIG, CHOLHDL, LDLDIRECT in the last 72 hours. No results for input(s): TSH, T4TOTAL, T3FREE, THYROIDAB in the last 72 hours.  Invalid input(s): FREET3 No results for input(s): VITAMINB12, FOLATE, FERRITIN, TIBC, IRON, RETICCTPCT in the last 72 hours. Coags: No results for input(s): INR in the last 72 hours.  Invalid input(s): PT Microbiology: Recent Results (from the past 240 hour(s))  Culture, blood (routine x 2)     Status: None (Preliminary result)   Collection Time: 06/12/18  9:28 AM  Result Value Ref Range Status   Specimen Description BLOOD RIGHT ANTECUBITAL  Final   Special Requests   Final    BOTTLES DRAWN AEROBIC AND ANAEROBIC Blood Culture results may not be optimal due to  an excessive volume of blood received in culture bottles   Culture   Final    NO GROWTH 2 DAYS Performed at [facility], [address] [place], [place] [address]    Report Status PENDING  Incomplete  Culture, blood (routine x 2)     Status: None (Preliminary result)   Collection Time: 06/12/18  9:31 AM  Result Value Ref Range Status   Specimen Description BLOOD LEFT ANTECUBITAL  Final   Special Requests   Final    BOTTLES DRAWN AEROBIC AND ANAEROBIC Blood Culture adequate volume   Culture   Final    NO GROWTH 2 DAYS Performed at [facility], [address] [place], [place] [address]    Report Status PENDING  Incomplete  CSF culture     Status: None (Preliminary result)   Collection Time: 06/12/18 11:45 AM  Result Value Ref Range Status   Specimen Description CSF  Final   Special Requests Normal  Final   Gram Stain   Final    WBC PRESENT, PREDOMINANTLY MONONUCLEAR NO ORGANISMS SEEN CYTOSPIN SMEAR    Culture   Final    NO GROWTH < 24 HOURS Performed at [facility], [address] [place], [place] [address]    Report  Status PENDING  Incomplete  Urine culture     Status: None   Collection Time: 06/12/18  3:08 PM  Result Value Ref Range Status   Specimen Description URINE, CLEAN CATCH  Final   Special Requests   Final    Normal Performed at [facility], [address], [place], [place] [address]    Culture   Final    Multiple bacterial morphotypes present, none predominant. Suggest appropriate recollection if clinically indicated.   Report Status 06/13/2018 FINAL  Final    FURTHER DISCHARGE INSTRUCTIONS:  Get Medicines reviewed and adjusted: Please take all your medications with you for your next visit with your Primary MD  Laboratory/radiological data: Please request your Primary MD to go over all hospital tests and procedure/radiological results at the follow up, please ask your Primary MD to get all Hospital records sent to his/her office.  In some cases, they will be blood work, cultures and biopsy results pending at the time of your discharge. Please request that your primary care M.D. goes through all the records of your hospital data and follows up on these results.  Also Note the following: If you experience worsening of your admission symptoms, develop shortness of breath, life threatening emergency, suicidal or homicidal thoughts you must seek medical attention immediately by calling 911 or calling your MD immediately  if symptoms less severe.  You must read complete instructions/literature along with all the possible adverse reactions/side effects for all the Medicines you take and that have been prescribed to you. Take any new Medicines after you have completely understood and accpet all the possible adverse reactions/side effects.   Do not drive when taking Pain medications or sleeping medications (Benzodaizepines)  Do not take more than prescribed Pain, Sleep and Anxiety Medications. It is not advisable to combine anxiety,sleep and pain medications without talking with your  primary care practitioner  Special Instructions: If you have smoked or chewed Tobacco  in the last 2 yrs please stop smoking, stop any regular Alcohol  and or any Recreational drug use.  Wear Seat belts while driving.  Please note: You were cared for by a hospitalist during your hospital stay. Once you are  discharged, your primary care physician will handle any further medical issues. Please note that NO REFILLS for any discharge medications will be authorized once you are discharged, as it is imperative that you return to your primary care physician (or establish a relationship with a primary care physician if you do not have one) for your post hospital discharge needs so that they can reassess your need for medications and monitor your lab values.  Total Time spent coordinating discharge including counseling, education and face to face time equals 25  minutes.  Signed: Scottlynn Lindell 06/14/2018 9:10 AM

## 2018-06-14 NOTE — Progress Notes (Signed)
Flossie Dibble discharged Home per MD order.  Discharge instructions reviewed and discussed with the patient, all questions and concerns answered. Copy of instructions and care notes for new diagnosis given to patient.  Allergies as of 06/14/2018   No Known Allergies     Medication List    TAKE these medications   acetaminophen 500 MG tablet Commonly known as:  TYLENOL Take 1,000 mg by mouth every 6 (six) hours as needed for mild pain or headache.   albuterol 108 (90 Base) MCG/ACT inhaler Commonly known as:  PROVENTIL HFA;VENTOLIN HFA Inhale 1-2 puffs into the lungs every 6 (six) hours as needed for wheezing or shortness of breath.   aspirin-acetaminophen-caffeine 250-250-65 MG tablet Commonly known as:  EXCEDRIN MIGRAINE Take 2 tablets by mouth every 6 (six) hours as needed for headache or migraine.   beclomethasone 40 MCG/ACT inhaler Commonly known as:  QVAR Inhale 2 puffs into the lungs daily. What changed:  when to take this   Black Currant Seed Oil 500 MG Caps Take 500 capsules by mouth daily.   BOTOX IJ Inject 1 each as directed every 3 (three) months.   cholecalciferol 25 MCG (1000 UT) tablet Commonly known as:  VITAMIN D3 Take 1,000 Units by mouth daily.   escitalopram 10 MG tablet Commonly known as:  LEXAPRO Take 10 mg by mouth at bedtime.   Fish Oil 1000 MG Caps Take 1,000 mg by mouth daily.   ibuprofen 600 MG tablet Commonly known as:  ADVIL,MOTRIN Take 1 tablet (600 mg total) by mouth every 6 (six) hours as needed for moderate pain. For AFTER surgery   montelukast 10 MG tablet Commonly known as:  SINGULAIR Take 1 tablet (10 mg total) by mouth at bedtime.   zolmitriptan 5 MG nasal solution Commonly known as:  Zomig 1 spray in one nostril at earliest onset of migraine.  May repeat dose x1 in 2h What changed:    how much to take  how to take this  when to take this  reasons to take this  additional instructions Notes to patient:  May repeat in  2 hours         Patient escorted to car by NT/volunteer in a wheelchair,  no distress noted upon discharge.  Laural Benes, Kaylor Maiers C 06/14/2018 12:49 PM

## 2018-06-14 NOTE — Progress Notes (Signed)
[facility]   Procedure Note Botox  Attending: Dr. Miles Leyda  Preoperative Diagnosis(es): Chronic migraine  Consent obtained from: The patient Benefits discussed included, but were not limited to decreased muscle tightness, increased joint range of motion, and decreased pain.  Risk discussed included, but were not limited pain and discomfort, bleeding, bruising, excessive weakness, venous thrombosis, muscle atrophy and dysphagia.  Anticipated outcomes of the procedure as well as he risks and benefits of the alternatives to the procedure, and the roles and tasks of the personnel to be involved, were discussed with the patient, and the patient consents to the procedure and agrees to proceed. A copy of the patient medication guide was given to the patient which explains the blackbox warning.  Patients identity and treatment sites confirmed Yes.  .  Details of Procedure: Skin was cleaned with alcohol. Prior to injection, the needle plunger was aspirated to make sure the needle was not within a blood vessel.  There was no blood retrieved on aspiration.    Following is a summary of the muscles injected  And the amount of Botulinum toxin used:  Dilution 200 units of Botox was reconstituted with 4 ml of preservative free normal saline. Time of reconstitution: At the time of the office visit (<30 minutes prior to injection)   Injections  155 total units of Botox was injected with a 30 gauge needle.  Injection Sites: L occipitalis: 15 units- 3 sites  R occiptalis: 15 units- 3 sites  L upper trapezius: 15 units- 3 sites R upper trapezius: 15 units- 3 sits          L paraspinal: 10 units- 2 sites R paraspinal: 10 units- 2 sites  Face L frontalis(2 injection sites):10 units   R frontalis(2 injection sites):10 units         L corrugator: 5 units   R corrugator: 5 units           Procerus: 5 units   L temporalis: 20 units R temporalis: 20 units   Agent:  200 units of botulinum Type A  (Onobotulinum Toxin type A) was reconstituted with 4 ml of preservative free normal saline.  Time of reconstitution: At the time of the office visit (<30 minutes prior to injection)     Total injected (Units): 155  Total wasted (Units): 10  Patient tolerated procedure well without complications.   Reinjection is anticipated in 3 months.   

## 2018-06-15 ENCOUNTER — Telehealth: Payer: Self-pay | Admitting: *Deleted

## 2018-06-15 LAB — CSF CULTURE W GRAM STAIN
Culture: NO GROWTH
Special Requests: NORMAL

## 2018-06-15 LAB — CSF CULTURE

## 2018-06-15 LAB — PATHOLOGIST SMEAR REVIEW

## 2018-06-15 NOTE — Telephone Encounter (Signed)
Pt was on TCM report called to make hospf/u w/ashleigh pt states she no longer see Ashleigh. She see Dr. Lloyd Huger @ [facility] now. Removed Ashleigh from PCP...lmb

## 2018-06-17 LAB — CULTURE, BLOOD (ROUTINE X 2)
Culture: NO GROWTH
Culture: NO GROWTH
Special Requests: ADEQUATE

## 2018-07-13 NOTE — Progress Notes (Signed)
Virtual Visit via Video Note The purpose of this virtual visit is to provide medical care while limiting exposure to the novel coronavirus.    Consent was obtained for video visit:  Yes.   Answered questions that patient had about telehealth interaction:  Yes.   I discussed the limitations, risks, security and privacy concerns of performing an evaluation and management service by telemedicine. I also discussed with the patient that there may be a patient responsible charge related to this service. The patient expressed understanding and agreed to proceed.  Pt location: Home Physician Location: office Name of referring provider:  Evaristo BuryShambley, Ashleigh N, NP I connected with Kayla Owens at patients initiation/request on 07/14/2018 at 10:30 AM EDT by video enabled telemedicine application and verified that I am speaking with the correct person using two identifiers. Pt MRN:  [id number] Pt DOB:  February 08, 1991 Video Participants:  Kayla Owens   History of Present Illness:  Kayla Owens is a 28 year old woman with asthma and depression who follows up for migraines.  UPDATE: She was admitted to the hospital on 06/12/18 for 3 days of worsening headache.  She didn't respond to Toradol injection.  She was afebrile.  In the ED, CT of head was performed which was personally reviewed and was unremarkable.  She underwent an LP which demonstrated elevated cell count of 820, predominantly lymphocytosis.  She was started on vancomycin, acyclovir and Rocephin.  CSF analysis demonstrated elevated protein 87, glucose 47, gram stain and culture negative, VDRL negative, HSV PCR negative.  She had night sweats for about a week following discharge.  She is now back to baseline.  She has not had any migraines or other headaches since hospital discharge.  Intensity:  0/10 Duration:  not applicable Frequency:  No headaches for past month. Frequency of abortive medication: 4 to 5 days a month Rescue protocol:   Takes an ibuprofen usually. Current NSAIDS: ibuprofen Current analgesics: Tylenol Current triptans: Zomig 5 mg NS (it worked within 15 minutes) Current ergotamine: None Current anti-emetic: None Current muscle relaxants: None Current anti-anxiolytic: None Current sleep aide: None Current Antihypertensive medications: None Current Antidepressant medications: Lexapro 10mg  Current Anticonvulsant medications: None Current anti-CGRP: None Current Vitamins/Herbal/Supplements: Multivitamin, fish oil Current Antihistamines/Decongestants: None Other therapy: Botox Hormone/birth control: None  Caffeine: No Diet: Hydrates, no soda Exercise: Yes Depression: Yes but controlled; Anxiety: Yes but controlled Other pain: No Sleep hygiene: Varies  HISTORY:  Onset: Age 28 Location:Frontal/bi-temporal,sometimes occipital as well Quality:Pounding/pressure Initial intensity:10/10 Aura:Sometimes preceded by blindspots in her vision Prodrome:no Postdrome:no Associated symptoms: Nausea, vomiting, photophobia, phonophobia, osmophobia. She has not had any new worse headache of her life, waking up from sleep Initial duration:At least 2 days Initial Frequency:16 headache days per month for several years Triggers: None Relieving factors: Rubbing temples, laying in dark quiet cool room Activity:Cannot function 4 days per month.  Past NSAIDS: Aleve, diclofenac, Mobic, toradol Past analgesics: acetaminophen, Goody, BC, Excedrin Past abortive triptans:Sumatriptan tablet, Maxalt Past muscle relaxants: tizanidine, cyclobenzaprine Past anti-emetic: promethazine Past antihypertensive medications:no Past antidepressant medications: Amitriptyline, nortriptyline, citalopram Past anticonvulsant medications:topiramate Past vitamins/Herbal/Supplements:no Other past therapies:no  Family history of headache/migraine: mother, maternal grandmother  Reportedly had MRI of  brain in 2009 which was negative.  Past Medical History: Past Medical History:  Diagnosis Date  . Anxiety   . Asthma   . Depression   . Intractable chronic migraine without aura    neurologist-- dr Shon Milletadam Dereona Kolodny (receives botox treatment)   Medications: Outpatient Encounter Medications as  of 07/14/2018  Medication Sig  . acetaminophen (TYLENOL) 500 MG tablet Take 1,000 mg by mouth every 6 (six) hours as needed for mild pain or headache.  . albuterol (PROVENTIL HFA;VENTOLIN HFA) 108 (90 Base) MCG/ACT inhaler Inhale 1-2 puffs into the lungs every 6 (six) hours as needed for wheezing or shortness of breath.  . beclomethasone (QVAR) 40 MCG/ACT inhaler Inhale 2 puffs into the lungs daily. (Patient taking differently: Inhale 2 puffs into the lungs 2 (two) times daily. )  . Black Currant Seed Oil 500 MG CAPS Take 500 capsules by mouth daily.  . cholecalciferol (VITAMIN D3) 25 MCG (1000 UT) tablet Take 1,000 Units by mouth daily.  Marland Kitchen escitalopram (LEXAPRO) 10 MG tablet Take 10 mg by mouth at bedtime.  Marland Kitchen ibuprofen (ADVIL,MOTRIN) 600 MG tablet Take 1 tablet (600 mg total) by mouth every 6 (six) hours as needed for moderate pain. For AFTER surgery  . montelukast (SINGULAIR) 10 MG tablet Take 1 tablet (10 mg total) by mouth at bedtime.  . Omega-3 Fatty Acids (FISH OIL) 1000 MG CAPS Take 1,000 mg by mouth daily.   . OnabotulinumtoxinA (BOTOX IJ) Inject 1 each as directed every 3 (three) months.  . zolmitriptan (ZOMIG) 5 MG nasal solution 1 spray in one nostril at earliest onset of migraine.  May repeat dose x1 in 2h (Patient taking differently: Place 2 sprays into the nose every 4 (four) hours as needed for migraine. )  . [DISCONTINUED] aspirin-acetaminophen-caffeine (EXCEDRIN MIGRAINE) 250-250-65 MG tablet Take 2 tablets by mouth every 6 (six) hours as needed for headache or migraine.   No facility-administered encounter medications on file as of 07/14/2018.    Allergies: No Known Allergies  Family  History: Family History  Problem Relation Age of Onset  . Arthritis Mother   . Hypertension Mother   . Lupus Mother   . Heart disease Maternal Grandmother   . Heart disease Maternal Grandfather   . Hypertension Maternal Grandfather   . Arthritis Paternal Grandmother   . Stomach cancer Paternal Grandfather   . Ovarian cancer Maternal Aunt   . Ovarian cancer Maternal Aunt     Social History: Social History   Socioeconomic History  . Marital status: Single    Spouse name: Not on file  . Number of children: 0  . Years of education: 11  . Highest education level: Not on file  Occupational History  . Occupation: Clinical cytogeneticist: [employer]  Social Needs  . Financial resource strain: Not on file  . Food insecurity:    Worry: Not on file    Inability: Not on file  . Transportation needs:    Medical: Not on file    Non-medical: Not on file  Tobacco Use  . Smoking status: Current Every Day Smoker    Types: Cigarettes  . Smokeless tobacco: Never Used  . Tobacco comment: smokes black and mild cigars QOD, patient states two a day  Substance and Sexual Activity  . Alcohol use: No  . Drug use: No  . Sexual activity: Yes  Lifestyle  . Physical activity:    Days per week: Not on file    Minutes per session: Not on file  . Stress: Not on file  Relationships  . Social connections:    Talks on phone: Not on file    Gets together: Not on file    Attends religious service: Not on file    Active member of club or organization: Not on file  Attends meetings of clubs or organizations: Not on file    Relationship status: Not on file  . Intimate partner violence:    Fear of current or ex partner: Not on file    Emotionally abused: Not on file    Physically abused: Not on file    Forced sexual activity: Not on file  Other Topics Concern  . Not on file  Social History Narrative   Fun/Hobby: Swimming / pool   Single, lives with boyfriend, avoids caffeine. Exercises 2x a week    Review Of Systems: Review of Systems  Constitutional: Negative for chills, fever and weight loss.  HENT: Negative for congestion, ear discharge, ear pain, hearing loss, nosebleeds, sore throat and tinnitus.   Eyes: Negative for blurred vision, double vision, pain, discharge and redness.  Respiratory: Negative for cough, sputum production, shortness of breath and wheezing.   Cardiovascular: Negative for chest pain, palpitations and orthopnea.  Gastrointestinal: Negative for abdominal pain, constipation, diarrhea, nausea and vomiting.  Genitourinary: Negative for dysuria.  Musculoskeletal: Negative for myalgias.  Skin: Negative for itching and rash.  Neurological: Negative for tremors, sensory change, speech change and focal weakness. Loss of consciousness: .vitalsn.  Endo/Heme/Allergies: Does not bruise/bleed easily.  Psychiatric/Behavioral: Positive for depression.   Observations/Objective:   Pulse 89, height 5' 7.5" (1.715 m), weight 249 lb (112.9 kg), SpO2 99 %. alert and oriented to person, place, and time. Attention span and concentration intact, recent and remote memory intact, fund of knowledge intact.  Speech fluent and not dysarthric, language intact.  Pupils round and equal.  Moves eyes in all directions.  Facial sensation intact.  Face symmetric.  No pronator drift.  Romberg negative.  Assessment and Plan:   1.  Migraine without aura, without status migrainosus, not intractable 2.  Viral meningitis. Recovered   1.  For preventative management, she is scheduled for next round of Botox in June. 2.  For abortive therapy, Zomig  NS.  Refilled today. 3.  Limit use of pain relievers to no more than 2 days out of week to prevent risk of rebound or medication-overuse headache. 4.  Keep headache diary 5.  Exercise, hydration, caffeine cessation, sleep hygiene, monitor for and avoid triggers 6.  Consider:  magnesium citrate  daily, riboflavin  daily, and coenzyme Q10   three times daily 7.  Follow up for next round of Botox  Follow Up Instructions:    -I discussed the assessment and treatment plan with the patient. The patient was provided an opportunity to ask questions and all were answered. The patient agreed with the plan and demonstrated an understanding of the instructions.   The patient was advised to call back or seek an in-person evaluation if the symptoms worsen or if the condition fails to improve as anticipated.  Cira Servant, DO

## 2018-07-14 ENCOUNTER — Telehealth (INDEPENDENT_AMBULATORY_CARE_PROVIDER_SITE_OTHER): Payer: Managed Care, Other (non HMO) | Admitting: Neurology

## 2018-07-14 ENCOUNTER — Other Ambulatory Visit: Payer: Self-pay

## 2018-07-14 ENCOUNTER — Encounter: Payer: Self-pay | Admitting: Neurology

## 2018-07-14 VITALS — HR 89 | Ht 67.5 in | Wt 249.0 lb

## 2018-07-14 DIAGNOSIS — A879 Viral meningitis, unspecified: Secondary | ICD-10-CM | POA: Diagnosis not present

## 2018-07-14 DIAGNOSIS — G43009 Migraine without aura, not intractable, without status migrainosus: Secondary | ICD-10-CM

## 2018-07-14 MED ORDER — ZOLMITRIPTAN 5 MG NA SOLN: NASAL | 5 refills | Status: DC

## 2018-07-21 ENCOUNTER — Ambulatory Visit: Payer: Self-pay | Admitting: Neurology

## 2018-08-24 ENCOUNTER — Telehealth: Payer: Self-pay | Admitting: Neurology

## 2018-08-24 NOTE — Telephone Encounter (Signed)
Patient was wanting to know about next BOTOX appt. When would it be? And she said to please leave detailed msg if we don't get her she is at work. Or send her a MyChart msg. Thanks!

## 2018-08-25 NOTE — Telephone Encounter (Signed)
Made qappt for 6/19 @ 9:10, sending Pt My Chart message

## 2018-09-13 ENCOUNTER — Encounter: Payer: Self-pay | Admitting: Nurse Practitioner

## 2018-09-24 ENCOUNTER — Other Ambulatory Visit: Payer: Self-pay

## 2018-09-24 ENCOUNTER — Ambulatory Visit (INDEPENDENT_AMBULATORY_CARE_PROVIDER_SITE_OTHER): Payer: Managed Care, Other (non HMO) | Admitting: Neurology

## 2018-09-24 DIAGNOSIS — G43709 Chronic migraine without aura, not intractable, without status migrainosus: Secondary | ICD-10-CM | POA: Diagnosis not present

## 2018-09-24 MED ORDER — ONABOTULINUMTOXINA 100 UNITS IJ SOLR
155.0000 [IU] | Freq: Once | INTRAMUSCULAR | Status: AC
  Administered 2018-09-24: 155 [IU] via INTRAMUSCULAR

## 2018-09-24 NOTE — Progress Notes (Signed)
[facility]   Procedure Note Botox  Attending: Dr. Deaira Leckey  Preoperative Diagnosis(es): Chronic migraine  Consent obtained from: The patient Benefits discussed included, but were not limited to decreased muscle tightness, increased joint range of motion, and decreased pain.  Risk discussed included, but were not limited pain and discomfort, bleeding, bruising, excessive weakness, venous thrombosis, muscle atrophy and dysphagia.  Anticipated outcomes of the procedure as well as he risks and benefits of the alternatives to the procedure, and the roles and tasks of the personnel to be involved, were discussed with the patient, and the patient consents to the procedure and agrees to proceed. A copy of the patient medication guide was given to the patient which explains the blackbox warning.  Patients identity and treatment sites confirmed Yes.  .  Details of Procedure: Skin was cleaned with alcohol. Prior to injection, the needle plunger was aspirated to make sure the needle was not within a blood vessel.  There was no blood retrieved on aspiration.    Following is a summary of the muscles injected  And the amount of Botulinum toxin used:  Dilution 200 units of Botox was reconstituted with 4 ml of preservative free normal saline. Time of reconstitution: At the time of the office visit (<30 minutes prior to injection)   Injections  155 total units of Botox was injected with a 30 gauge needle.  Injection Sites: L occipitalis: 15 units- 3 sites  R occiptalis: 15 units- 3 sites  L upper trapezius: 15 units- 3 sites R upper trapezius: 15 units- 3 sits          L paraspinal: 10 units- 2 sites R paraspinal: 10 units- 2 sites  Face L frontalis(2 injection sites):10 units   R frontalis(2 injection sites):10 units         L corrugator: 5 units   R corrugator: 5 units           Procerus: 5 units   L temporalis: 20 units R temporalis: 20 units   Agent:  200 units of botulinum Type A  (Onobotulinum Toxin type A) was reconstituted with 4 ml of preservative free normal saline.  Time of reconstitution: At the time of the office visit (<30 minutes prior to injection)     Total injected (Units): 155  Total wasted (Units): 15  Patient tolerated procedure well without complications.   Reinjection is anticipated in 3 months.  

## 2018-12-24 ENCOUNTER — Ambulatory Visit (INDEPENDENT_AMBULATORY_CARE_PROVIDER_SITE_OTHER): Payer: Managed Care, Other (non HMO) | Admitting: Neurology

## 2018-12-24 ENCOUNTER — Other Ambulatory Visit: Payer: Self-pay

## 2018-12-24 DIAGNOSIS — G43709 Chronic migraine without aura, not intractable, without status migrainosus: Secondary | ICD-10-CM | POA: Diagnosis not present

## 2018-12-24 MED ORDER — ONABOTULINUMTOXINA 100 UNITS IJ SOLR
100.0000 [IU] | Freq: Once | INTRAMUSCULAR | Status: AC
  Administered 2018-12-24: 155 [IU] via INTRAMUSCULAR

## 2018-12-24 NOTE — Progress Notes (Signed)
[facility]   Procedure Note Botox  Attending: Dr. Metta Clines  Preoperative Diagnosis(es): Chronic migraine  Consent obtained from: Patient Benefits discussed included, but were not limited to decreased muscle tightness, increased joint range of motion, and decreased pain.  Risk discussed included, but were not limited pain and discomfort, bleeding, bruising, excessive weakness, venous thrombosis, muscle atrophy and dysphagia.  Anticipated outcomes of the procedure as well as he risks and benefits of the alternatives to the procedure, and the roles and tasks of the personnel to be involved, were discussed with the patient, and the patient consents to the procedure and agrees to proceed. A copy of the patient medication guide was given to the patient which explains the blackbox warning.  Patients identity and treatment sites confirmed Yes.  Details of Procedure: Skin was cleaned with alcohol. Prior to injection, the needle plunger was aspirated to make sure the needle was not within a blood vessel.  There was no blood retrieved on aspiration.    Following is a summary of the muscles injected  And the amount of Botulinum toxin used:  Dilution 200 units of Botox was reconstituted with 4 ml of preservative free normal saline. Time of reconstitution: At the time of the office visit (<30 minutes prior to injection)   Injections  155 total units of Botox was injected with a 30 gauge needle.  Injection Sites: L occipitalis: 15 units- 3 sites  R occiptalis: 15 units- 3 sites  L upper trapezius: 15 units- 3 sites R upper trapezius: 15 units- 3 sits          L paraspinal: 10 units- 2 sites R paraspinal: 10 units- 2 sites  Face L frontalis(2 injection sites):10 units   R frontalis(2 injection sites):10 units         L corrugator: 5 units   R corrugator: 5 units           Procerus: 5 units   L temporalis: 20 units R temporalis: 20 units   Agent:  200 units of botulinum Type A  (Onobotulinum Toxin type A) was reconstituted with 4 ml of preservative free normal saline.  Time of reconstitution: At the time of the office visit (<30 minutes prior to injection)     Total injected (Units): 155  Total wasted (Units): 9  Patient tolerated procedure well without complications.   Reinjection is anticipated in 3 months. Return to clinic in about 6 weeks.

## 2019-02-10 ENCOUNTER — Telehealth: Payer: Managed Care, Other (non HMO) | Admitting: Neurology

## 2019-03-15 NOTE — Progress Notes (Signed)
Virtual Visit via Video Note The purpose of this virtual visit is to provide medical care while limiting exposure to the novel coronavirus.    Consent was obtained for video visit:  Yes Answered questions that patient had about telehealth interaction:  Yes I discussed the limitations, risks, security and privacy concerns of performing an evaluation and management service by telemedicine. I also discussed with the patient that there may be a patient responsible charge related to this service. The patient expressed understanding and agreed to proceed.  Pt location: Work Physician Location: Home Name of referring provider:  Bryon LionsMoreira, Niall A, PA-C I connected with Kayla DibbleJanasia I [place]ock at patients initiation/request on 03/17/2019 at  9:30 AM EST by video enabled telemedicine application and verified that I am speaking with the correct person using two identifiers. Pt MRN:  [id number] Pt DOB:  May 18, 1990 Video Participants:  Kayla DibbleJanasia I [place]  History of Present Illness:  Kayla Owens is a 28 year old woman with asthma and depression and history of viral meningitis (06/2018) who follows up for migraines.  UPDATE: She had one migraine over past 30 days, severe and lasted several hours.  It occurred the week after Thanksgiving.  It kept her out of work that day. She took Zomig and switched off between Tylenol, ibuprofen and Excedrin.  2 days later, she had numbness and tingling down left arm to the last 3 digits of her hand.  It lasted for 6 hours.  Some associated pain from the shoulder down the back of her arm to the elbow.  No weakness.  She woke up in bed and noticed it.  No recurrence.   Rescue protocol: Takes an ibuprofen usually. Current NSAIDS:ibuprofen Current analgesics:Tylenol, Excedrin Current triptans:Zomig 5 mg NS (it worked within 15 minutes) Current ergotamine:None Current anti-emetic:None Current muscle relaxants:None Current anti-anxiolytic:None Current sleep  aide:None Current Antihypertensive medications:None Current Antidepressant medications:Lexapro 10mg  Current Anticonvulsant medications:None Current anti-CGRP:None Current Vitamins/Herbal/Supplements:Multivitamin, fish oil Current Antihistamines/Decongestants:None Other therapy:Botox Hormone/birth control: None    Caffeine:No Diet:Hydrates, no soda Exercise:Yes Depression:Yes but controlled; Anxiety:Yes but controlled Other pain:No Sleep hygiene:Varies  HISTORY: Onset:  28 years old Location:Frontal/bi-temporal,sometimes occipital as well Quality:Pounding/pressure Initial intensity:10/10 Aura:Sometimes preceded by blindspots in her vision Prodrome:no Postdrome:no Associated symptoms: Nausea vomiting, photophobia, phonophobia, osmophobia. She has not had any new worse headache of her life, waking up from sleep Initial duration:At least 2 days InitialFrequency:16 headache days per month for several years Triggers:  None Relieving factors:  Rubbing temples, laying in dark quiet cool room Activity:Cannot function 4 days per month.  Past NSAIDS: Aleve, diclofenac, Mobic, toradol Past analgesics: acetaminophen, Goody, BC, Excedrin Past abortive triptans:Sumatriptan tablet, Maxalt Past muscle relaxants: tizanidine, cyclobenzaprine Past anti-emetic: promethazine Past antihypertensive medications:no Past antidepressant medications: Amitriptyline, nortriptyline, citalopram Past anticonvulsant medications:topiramate Past vitamins/Herbal/Supplements:no Other past therapies:no  Family history of headache/migraine: mother, maternal grandmother  Reportedly had MRI of brain in 2009 which was negative.  Past Medical History: Past Medical History:  Diagnosis Date  . Anxiety   . Asthma   . Depression   . Intractable chronic migraine without aura    neurologist-- dr Shon Milletadam jaffe (receives botox treatment)    Medications:  Outpatient Encounter Medications as of 03/17/2019  Medication Sig  . acetaminophen (TYLENOL) 500 MG tablet Take 1,000 mg by mouth every 6 (six) hours as needed for mild pain or headache.  . albuterol (PROVENTIL HFA;VENTOLIN HFA) 108 (90 Base) MCG/ACT inhaler Inhale 1-2 puffs into the lungs every 6 (six) hours as needed for wheezing or shortness  of breath.  . beclomethasone (QVAR) 40 MCG/ACT inhaler Inhale 2 puffs into the lungs daily. (Patient taking differently: Inhale 2 puffs into the lungs 2 (two) times daily. )  . Black Currant Seed Oil 500 MG CAPS Take 500 capsules by mouth daily.  . cholecalciferol (VITAMIN D3) 25 MCG (1000 UT) tablet Take 1,000 Units by mouth daily.  Marland Kitchen escitalopram (LEXAPRO) 10 MG tablet Take 10 mg by mouth at bedtime.  Marland Kitchen ibuprofen (ADVIL,MOTRIN) 600 MG tablet Take 1 tablet (600 mg total) by mouth every 6 (six) hours as needed for moderate pain. For AFTER surgery  . montelukast (SINGULAIR) 10 MG tablet Take 1 tablet (10 mg total) by mouth at bedtime.  . Omega-3 Fatty Acids (FISH OIL) 1000 MG CAPS Take 1,000 mg by mouth daily.   . OnabotulinumtoxinA (BOTOX IJ) Inject 1 each as directed every 3 (three) months.  . zolmitriptan (ZOMIG) 5 MG nasal solution 1 spray in one nostril at earliest onset of migraine.  May repeat dose x1 in 2h   No facility-administered encounter medications on file as of 03/17/2019.     Allergies: No Known Allergies  Family History: Family History  Problem Relation Age of Onset  . Arthritis Mother   . Hypertension Mother   . Lupus Mother   . Heart disease Maternal Grandmother   . Heart disease Maternal Grandfather   . Hypertension Maternal Grandfather   . Arthritis Paternal Grandmother   . Stomach cancer Paternal Grandfather   . Ovarian cancer Maternal Aunt   . Ovarian cancer Maternal Aunt     Social History: Social History   Socioeconomic History  . Marital status: Single    Spouse name: Not on file  . Number of children: 0  .  Years of education: 76  . Highest education level: Not on file  Occupational History  . Occupation: Museum/gallery curator: [employer]  . Financial resource strain: Not on file  . Food insecurity    Worry: Not on file    Inability: Not on file  . Transportation needs    Medical: Not on file    Non-medical: Not on file  Tobacco Use  . Smoking status: Current Every Day Smoker    Types: Cigarettes  . Smokeless tobacco: Never Used  . Tobacco comment: smokes black and mild cigars QOD, patient states two a day  Substance and Sexual Activity  . Alcohol use: No  . Drug use: No  . Sexual activity: Yes  Lifestyle  . Physical activity    Days per week: Not on file    Minutes per session: Not on file  . Stress: Not on file  Relationships  . Social Herbalist on phone: Not on file    Gets together: Not on file    Attends religious service: Not on file    Active member of club or organization: Not on file    Attends meetings of clubs or organizations: Not on file    Relationship status: Not on file  . Intimate partner violence    Fear of current or ex partner: Not on file    Emotionally abused: Not on file    Physically abused: Not on file    Forced sexual activity: Not on file  Other Topics Concern  . Not on file  Social History Narrative   Fun/Hobby: Swimming / pool   Single, lives with boyfriend, avoids caffeine. Exercises 2x a week    Observations/Objective:  Pulse 98, temperature (!) 97.3 F (36.3 C), height 5' 7.5" (1.715 m), weight 257 lb (116.6 kg), SpO2 99 %. No acute distress.  Alert and oriented.  Speech fluent and not dysarthric.  Language intact.  Eyes orthophoric on primary gaze.  Face symmetric.  Assessment and Plan:   1.  Migraine without aura, without status migrainosus, not intractable.  This past migraine was severe but the exception.  Will monitor. 2.  Suspect lower cervical radiculopathy.  Resolved now.  Continue to monitor.  1.  For  preventative management, Botox 2.  For abortive therapy, Zomig 5mg  NS 3.  Limit use of pain relievers to no more than 2 days out of week to prevent risk of rebound or medication-overuse headache. 4.  Keep headache diary 5.  Exercise, hydration, caffeine cessation, sleep hygiene, monitor for and avoid triggers 6.  Consider:  magnesium citrate 400mg  daily, riboflavin 400mg  daily, and coenzyme Q10 100mg  three times daily 7. Follow up for Botox on 12/18.   Follow Up Instructions:    -I discussed the assessment and treatment plan with the patient. The patient was provided an opportunity to ask questions and all were answered. The patient agreed with the plan and demonstrated an understanding of the instructions.   The patient was advised to call back or seek an in-person evaluation if the symptoms worsen or if the condition fails to improve as anticipated.    , DO

## 2019-03-17 ENCOUNTER — Encounter: Payer: Self-pay | Admitting: Neurology

## 2019-03-17 ENCOUNTER — Other Ambulatory Visit: Payer: Self-pay

## 2019-03-17 ENCOUNTER — Telehealth (INDEPENDENT_AMBULATORY_CARE_PROVIDER_SITE_OTHER): Payer: Managed Care, Other (non HMO) | Admitting: Neurology

## 2019-03-17 VITALS — HR 98 | Temp 97.3°F | Ht 67.5 in | Wt 257.0 lb

## 2019-03-17 DIAGNOSIS — M5412 Radiculopathy, cervical region: Secondary | ICD-10-CM | POA: Diagnosis not present

## 2019-03-17 DIAGNOSIS — G43009 Migraine without aura, not intractable, without status migrainosus: Secondary | ICD-10-CM

## 2019-03-25 ENCOUNTER — Ambulatory Visit (INDEPENDENT_AMBULATORY_CARE_PROVIDER_SITE_OTHER): Payer: Managed Care, Other (non HMO) | Admitting: Neurology

## 2019-03-25 ENCOUNTER — Other Ambulatory Visit: Payer: Self-pay

## 2019-03-25 DIAGNOSIS — G43709 Chronic migraine without aura, not intractable, without status migrainosus: Secondary | ICD-10-CM

## 2019-03-25 MED ORDER — ONABOTULINUMTOXINA 100 UNITS IJ SOLR
155.0000 [IU] | Freq: Once | INTRAMUSCULAR | Status: AC
  Administered 2019-03-25: 155 [IU] via INTRAMUSCULAR

## 2019-03-25 NOTE — Progress Notes (Signed)
[facility]  ° °Procedure Note Botox ° °Attending: Dr. Lajoya Dombek ° °Preoperative Diagnosis(es): Chronic migraine ° °Consent obtained from: The patient °Benefits discussed included, but were not limited to decreased muscle tightness, increased joint range of motion, and decreased pain.  Risk discussed included, but were not limited pain and discomfort, bleeding, bruising, excessive weakness, venous thrombosis, muscle atrophy and dysphagia.  Anticipated outcomes of the procedure as well as he risks and benefits of the alternatives to the procedure, and the roles and tasks of the personnel to be involved, were discussed with the patient, and the patient consents to the procedure and agrees to proceed. A copy of the patient medication guide was given to the patient which explains the blackbox warning. ° °Patients identity and treatment sites confirmed Yes.  . ° °Details of Procedure: °Skin was cleaned with alcohol. Prior to injection, the needle plunger was aspirated to make sure the needle was not within a blood vessel.  There was no blood retrieved on aspiration.   ° °Following is a summary of the muscles injected  And the amount of Botulinum toxin used: ° °Dilution °200 units of Botox was reconstituted with 4 ml of preservative free normal saline. °Time of reconstitution: At the time of the office visit (<30 minutes prior to injection)  ° °Injections  °155 total units of Botox was injected with a 30 gauge needle. ° °Injection Sites: °L occipitalis: 15 units- 3 sites  °R occiptalis: 15 units- 3 sites ° °L upper trapezius: 15 units- 3 sites °R upper trapezius: 15 units- 3 sits          °L paraspinal: 10 units- 2 sites °R paraspinal: 10 units- 2 sites ° °Face °L frontalis(2 injection sites):10 units   °R frontalis(2 injection sites):10 units         °L corrugator: 5 units   °R corrugator: 5 units           °Procerus: 5 units   °L temporalis: 20 units °R temporalis: 20 units  ° °Agent:  °200 units of botulinum Type  A (Onobotulinum Toxin type A) was reconstituted with 4 ml of preservative free normal saline.  °Time of reconstitution: At the time of the office visit (<30 minutes prior to injection)  ° ° ° Total injected (Units):  155 ° Total wasted (Units):  5 ° °Patient tolerated procedure well without complications.   °Reinjection is anticipated in 3 months. ° ° °

## 2019-05-19 MED FILL — IBUPROFEN 600 MG TABLET: 600 | 4 days supply | Qty: 14 | Fill #0

## 2019-07-11 ENCOUNTER — Encounter: Payer: Self-pay | Admitting: *Deleted

## 2019-07-11 NOTE — Progress Notes (Addendum)
[facility]  Benefit Verification [id number] Submitted! Please allow 24-48 hours for BV results.   Service Request # [id number]  BV Submitted Date 07/11/2019 BV Completed Date 07/13/2019 BV Status Covered Sent coverage info to be scanned into her chart

## 2019-07-15 ENCOUNTER — Encounter: Payer: Self-pay | Admitting: Neurology

## 2019-07-15 ENCOUNTER — Ambulatory Visit (INDEPENDENT_AMBULATORY_CARE_PROVIDER_SITE_OTHER): Payer: Managed Care, Other (non HMO) | Admitting: Neurology

## 2019-07-15 ENCOUNTER — Other Ambulatory Visit: Payer: Self-pay

## 2019-07-15 DIAGNOSIS — G43709 Chronic migraine without aura, not intractable, without status migrainosus: Secondary | ICD-10-CM

## 2019-07-15 MED ORDER — ONABOTULINUMTOXINA 100 UNITS IJ SOLR
100.0000 [IU] | Freq: Once | INTRAMUSCULAR | Status: AC
  Administered 2019-07-15: 55 [IU] via INTRAMUSCULAR

## 2019-07-15 MED ORDER — ONABOTULINUMTOXINA 100 UNITS IJ SOLR
100.0000 [IU] | Freq: Once | INTRAMUSCULAR | Status: AC
  Administered 2019-07-15: 100 [IU] via INTRAMUSCULAR

## 2019-07-15 NOTE — Progress Notes (Addendum)
[facility]   Procedure Note Botox  Attending: Dr. Akito Boomhower  Preoperative Diagnosis(es): Chronic migraine  Consent obtained from: Patient Benefits discussed included, but were not limited to decreased muscle tightness, increased joint range of motion, and decreased pain.  Risk discussed included, but were not limited pain and discomfort, bleeding, bruising, excessive weakness, venous thrombosis, muscle atrophy and dysphagia.  Anticipated outcomes of the procedure as well as he risks and benefits of the alternatives to the procedure, and the roles and tasks of the personnel to be involved, were discussed with the patient, and the patient consents to the procedure and agrees to proceed. A copy of the patient medication guide was given to the patient which explains the blackbox warning.  Patients identity and treatment sites confirmed Yes.  Details of Procedure: Skin was cleaned with alcohol. Prior to injection, the needle plunger was aspirated to make sure the needle was not within a blood vessel.  There was no blood retrieved on aspiration.    Following is a summary of the muscles injected  And the amount of Botulinum toxin used:  Dilution 200 units of Botox was reconstituted with 4 ml of preservative free normal saline. Time of reconstitution: At the time of the office visit (<30 minutes prior to injection)   Injections  155 total units of Botox was injected with a 30 gauge needle.  Injection Sites: L occipitalis: 15 units- 3 sites  R occiptalis: 15 units- 3 sites  L upper trapezius: 15 units- 3 sites R upper trapezius: 15 units- 3 sits          L paraspinal: 10 units- 2 sites R paraspinal: 10 units- 2 sites  Face L frontalis(2 injection sites):10 units   R frontalis(2 injection sites):10 units         L corrugator: 5 units   R corrugator: 5 units           Procerus: 5 units   L temporalis: 20 units R temporalis: 20 units   Agent:  200 units of botulinum Type A  (Onobotulinum Toxin type A) was reconstituted with 4 ml of preservative free normal saline.  Time of reconstitution: At the time of the office visit (<30 minutes prior to injection)     Total injected (Units): 155  Total wasted (Units): 10  Patient tolerated procedure well without complications.   Reinjection is anticipated in 3 months. Return to clinic in 4 1/2 months   

## 2019-08-10 ENCOUNTER — Other Ambulatory Visit: Payer: Self-pay

## 2019-08-10 ENCOUNTER — Encounter (HOSPITAL_COMMUNITY): Payer: Self-pay | Admitting: Emergency Medicine

## 2019-08-10 ENCOUNTER — Emergency Department (HOSPITAL_COMMUNITY)
Admission: EM | Admit: 2019-08-10 | Discharge: 2019-08-10 | Disposition: A | Discharge status: Home / Self Care | Payer: Managed Care, Other (non HMO) | Attending: Emergency Medicine | Admitting: Emergency Medicine

## 2019-08-10 ENCOUNTER — Emergency Department (HOSPITAL_COMMUNITY): Payer: Managed Care, Other (non HMO)

## 2019-08-10 DIAGNOSIS — N939 Abnormal uterine and vaginal bleeding, unspecified: Secondary | ICD-10-CM | POA: Diagnosis not present

## 2019-08-10 DIAGNOSIS — N83201 Unspecified ovarian cyst, right side: Secondary | ICD-10-CM | POA: Diagnosis not present

## 2019-08-10 DIAGNOSIS — F17210 Nicotine dependence, cigarettes, uncomplicated: Secondary | ICD-10-CM | POA: Diagnosis not present

## 2019-08-10 DIAGNOSIS — R102 Pelvic and perineal pain: Secondary | ICD-10-CM | POA: Diagnosis present

## 2019-08-10 DIAGNOSIS — N83202 Unspecified ovarian cyst, left side: Secondary | ICD-10-CM | POA: Insufficient documentation

## 2019-08-10 DIAGNOSIS — J45909 Unspecified asthma, uncomplicated: Secondary | ICD-10-CM | POA: Insufficient documentation

## 2019-08-10 DIAGNOSIS — Z79899 Other long term (current) drug therapy: Secondary | ICD-10-CM | POA: Diagnosis not present

## 2019-08-10 HISTORY — DX: Unspecified ovarian cyst, left side: N83.201

## 2019-08-10 HISTORY — DX: Unspecified ovarian cyst, right side: N83.202

## 2019-08-10 LAB — URINALYSIS, ROUTINE W REFLEX MICROSCOPIC
Bilirubin Urine: NEGATIVE
Glucose, UA: NEGATIVE mg/dL
Ketones, ur: NEGATIVE mg/dL
Leukocytes,Ua: NEGATIVE
Nitrite: NEGATIVE
Protein, ur: NEGATIVE mg/dL
RBC / HPF: >50 RBC/hpf — ABNORMAL HIGH (ref 0–5)
Specific Gravity, Urine: 1.023 (ref 1.005–1.030)
pH: 6 (ref 5.0–8.0)

## 2019-08-10 LAB — I-STAT CHEM 8, ED
BUN: 10 mg/dL (ref 6–20)
Calcium, Ion: 1.21 mmol/L (ref 1.15–1.40)
Chloride: 106 mmol/L (ref 98–111)
Creatinine, Ser: 0.8 mg/dL (ref 0.44–1.00)
Glucose, Bld: 101 mg/dL — ABNORMAL HIGH (ref 70–99)
HCT: 36 % (ref 36.0–46.0)
Hemoglobin: 12.2 g/dL (ref 12.0–15.0)
Potassium: 3.8 mmol/L (ref 3.5–5.1)
Sodium: 142 mmol/L (ref 135–145)
TCO2: 24 mmol/L (ref 22–32)

## 2019-08-10 LAB — I-STAT BETA HCG BLOOD, ED (MC, WL, AP ONLY): I-stat hCG, quantitative: <5 m[IU]/mL (ref <5)

## 2019-08-10 LAB — WET PREP, GENITAL
Clue Cells Wet Prep HPF POC: NONE SEEN
Sperm: NONE SEEN
Trich, Wet Prep: NONE SEEN
Yeast Wet Prep HPF POC: NONE SEEN

## 2019-08-10 MED ORDER — METOCLOPRAMIDE HCL 5 MG/ML IJ SOLN
5.0000 mg | Freq: Once | INTRAMUSCULAR | Status: AC
  Administered 2019-08-10: 5 mg via INTRAVENOUS
  Filled 2019-08-10: qty 2

## 2019-08-10 MED ORDER — IBUPROFEN 200 MG PO TABS
600.0000 mg | ORAL_TABLET | Freq: Once | ORAL | Status: AC
  Administered 2019-08-10: 600 mg via ORAL
  Filled 2019-08-10: qty 3

## 2019-08-10 MED ORDER — ONDANSETRON 4 MG PO TBDP
4.0000 mg | ORAL_TABLET | Freq: Once | ORAL | Status: AC
  Administered 2019-08-10: 4 mg via ORAL
  Filled 2019-08-10: qty 1

## 2019-08-10 MED ORDER — TRAMADOL HCL 50 MG PO TABS: 50.0000 mg | ORAL_TABLET | Freq: Two times a day (BID) | ORAL | 0 refills | Status: DC | PRN

## 2019-08-10 MED ORDER — ONDANSETRON HCL 4 MG PO TABS
4.0000 mg | ORAL_TABLET | Freq: Four times a day (QID) | ORAL | 0 refills | Status: DC
Start: 2019-08-10 — End: 2020-03-16

## 2019-08-10 MED ORDER — KETOROLAC TROMETHAMINE 30 MG/ML IJ SOLN
30.0000 mg | Freq: Once | INTRAMUSCULAR | Status: AC
  Administered 2019-08-10: 30 mg via INTRAVENOUS
  Filled 2019-08-10: qty 1

## 2019-08-10 NOTE — ED Provider Notes (Signed)
[facility]-EMERGENCY DEPT Provider Note   CSN: [id number] Arrival date & time: 08/10/19  0854     History Chief Complaint  Patient presents with  . Vaginal Bleeding    Kayla Owens is a 29 y.o. female.  HPI   29 year old female with a history of anxiety, asthma, bilateral ovarian cysts, depression, migraines, who presents to the emergency department today for evaluation of left pelvic pain.  States it started suddenly last night around midnight.  Pain radiates to her back as well.  It is severe in nature and she has tried Motrin and Tylenol without relief.  She has also started having menstrual bleeding as well.  States that it is around the time that her menstrual cycle would be starting.  She has had heavy bleeding as compared to her typical menstrual cycles and has already gone through 3 pads this morning since she woke up at 5 AM.  She is also been passing large clots which is not typical for her.  She does have a history of menstrual cramping but it is usually mild and is not usually on one side.  She does have a history of ovarian cyst.  She called her OB/GYN who advised her to come to the ED.  She has an appointment with them tomorrow.  She denies any urinary symptoms or vomiting.  Denies any diarrhea or constipation.  She does have some associated nausea.  Denies any abnormal vaginal discharge or concern for STD.  Past Medical History:  Diagnosis Date  . Anxiety   . Asthma   . Bilateral ovarian cysts   . Depression   . Intractable chronic migraine without aura    neurologist-- dr Shon Millet (receives botox treatment)    Patient Active Problem List   Diagnosis Date Noted  . Viral meningitis 06/12/2018  . Tobacco use 06/09/2018  . Anxiety 04/28/2018  . Severe episode of recurrent major depressive disorder, without psychotic features (HCC) 04/28/2018  . Moderate persistent asthma without complication 04/28/2018  . Class 2 obesity due to excess  calories without serious comorbidity with body mass index (BMI) of 38.0 to 38.9 in adult 11/04/2016  . Migraine with aura and without status migrainosus, not intractable 10/03/2016  . Moderate persistent asthma 10/03/2016  . Anxiety and depression 10/03/2016  . Asthma 04/07/1998  . Migraine 04/07/1998    Past Surgical History:  Procedure Laterality Date  . biopsy of cervix    . WISDOM TOOTH EXTRACTION       OB History   No obstetric history on file.     Family History  Problem Relation Age of Onset  . Arthritis Mother   . Hypertension Mother   . Lupus Mother   . Heart disease Maternal Grandmother   . Heart disease Maternal Grandfather   . Hypertension Maternal Grandfather   . Arthritis Paternal Grandmother   . Stomach cancer Paternal Grandfather   . Ovarian cancer Maternal Aunt   . Ovarian cancer Maternal Aunt     Social History   Tobacco Use  . Smoking status: Current Every Day Smoker    Types: Cigarettes  . Smokeless tobacco: Never Used  . Tobacco comment: smokes black and mild cigars QOD, patient states two a day  Substance Use Topics  . Alcohol use: No  . Drug use: No    Home Medications Prior to Admission medications   Medication Sig Start Date End Date Taking? Authorizing Provider  acetaminophen (TYLENOL) 500 MG tablet Take 500 mg  by mouth every 6 (six) hours as needed for mild pain or headache.    Yes [provider]  albuterol (PROVENTIL HFA;VENTOLIN HFA) 108 (90 Base) MCG/ACT inhaler Inhale 1-2 puffs into the lungs every 6 (six) hours as needed for wheezing or shortness of breath. 02/06/17  Yes Lance Sell, NP  aspirin-acetaminophen-caffeine (EXCEDRIN MIGRAINE) [id number] MG tablet Take 1 tablet by mouth every 6 (six) hours as needed for headache.   Yes [provider]  beclomethasone (QVAR) 40 MCG/ACT inhaler Inhale 2 puffs into the lungs daily. Patient taking differently: Inhale 2 puffs into the lungs 2 (two) times daily.   02/06/17  Yes Lance Sell, NP  DULoxetine (CYMBALTA) 30 MG capsule Take 30 mg by mouth daily. 07/19/19  Yes [provider]  ibuprofen (ADVIL) 200 MG tablet Take 200 mg by mouth every 6 (six) hours as needed for headache, mild pain or moderate pain.   Yes [provider]  montelukast (SINGULAIR) 10 MG tablet Take 1 tablet (10 mg total) by mouth at bedtime. 02/06/17  Yes Lance Sell, NP  OnabotulinumtoxinA (BOTOX IJ) Inject 1 each as directed every 3 (three) months.   Yes [provider]  zolmitriptan (ZOMIG) 5 MG nasal solution 1 spray in one nostril at earliest onset of migraine.  May repeat dose x1 in 2h Patient taking differently: Place 1 spray into the nose as needed for migraine. 1 spray in one nostril at earliest onset of migraine.  May repeat dose x1 in 2h 07/14/18  Yes Jaffe, Adam R, DO  ibuprofen (ADVIL,MOTRIN) 600 MG tablet Take 1 tablet (600 mg total) by mouth every 6 (six) hours as needed for moderate pain. For AFTER surgery Patient not taking: Reported on 08/10/2019 04/14/18   Joylene John D, NP  traMADol (ULTRAM) 50 MG tablet Take 1 tablet (50 mg total) by mouth every 12 (twelve) hours as needed. 08/10/19   Meleana Commerford S, PA-C    Allergies    Patient has no known allergies.  Review of Systems   Review of Systems  Constitutional: Negative for fever.  HENT: Negative for ear pain and sore throat.   Eyes: Negative for pain, redness and visual disturbance.  Respiratory: Negative for cough and shortness of breath.   Cardiovascular: Negative for chest pain.  Gastrointestinal: Positive for nausea. Negative for abdominal pain, constipation, diarrhea and vomiting.  Genitourinary: Positive for pelvic pain and vaginal bleeding. Negative for dysuria, hematuria and vaginal discharge.  Musculoskeletal: Positive for back pain.  Skin: Negative for rash.  Neurological: Negative for headaches.  All other systems reviewed and are negative.   Physical  Exam Updated Vital Signs BP (!) 149/77   Pulse 63   Temp 98 F (36.7 C) (Oral)   Resp 18   SpO2 95%   Physical Exam Vitals and nursing note reviewed.  Constitutional:      General: She is not in acute distress.    Appearance: She is well-developed.  HENT:     Head: Normocephalic and atraumatic.  Eyes:     Conjunctiva/sclera: Conjunctivae normal.  Cardiovascular:     Rate and Rhythm: Normal rate.  Pulmonary:     Effort: Pulmonary effort is normal.  Abdominal:     General: Bowel sounds are normal.     Palpations: Abdomen is soft.     Tenderness: There is abdominal tenderness in the left lower quadrant. There is no rebound.  Genitourinary:    Comments: Exam performed by Rodney Booze,  exam  chaperoned Date: 08/10/2019 Pelvic exam: normal external genitalia without evidence of trauma. VULVA: normal appearing vulva with no masses, tenderness or lesion. VAGINA: normal appearing vagina with normal color and discharge, no lesions. Moderate blood in the vaginal vault.  CERVIX: normal appearing cervix without lesions, cervical motion tenderness absent, cervical os closed with out purulent discharge;, Wet prep and DNA probe for chlamydia and GC obtained.   ADNEXA: normal adnexa in size, and no masses, left adnexal TTP noted UTERUS: uterus is normal size, shape, consistency and nontender.   Musculoskeletal:     Cervical back: Neck supple.  Skin:    General: Skin is warm and dry.  Neurological:     Mental Status: She is alert.     ED Results / Procedures / Treatments   Labs (all labs ordered are listed, but only abnormal results are displayed) Labs Reviewed  WET PREP, GENITAL - Abnormal; Notable for the following components:      Result Value   WBC, Wet Prep HPF POC FEW (*)    All other components within normal limits  URINALYSIS, ROUTINE W REFLEX MICROSCOPIC - Abnormal; Notable for the following components:   Hgb urine dipstick LARGE (*)    RBC / HPF >50 (*)     Bacteria, UA RARE (*)    All other components within normal limits  I-STAT CHEM 8, ED - Abnormal; Notable for the following components:   Glucose, Bld 101 (*)    All other components within normal limits  I-STAT BETA HCG BLOOD, ED ([facility], [facility], [facility] ONLY)  GC/CHLAMYDIA PROBE AMP ([facility]) NOT AT [facility]    EKG None  Radiology US Transvaginal Non-OB  Result Date: 08/10/2019 CLINICAL DATA:  Left lower quadrant pain. EXAM: TRANSABDOMINAL AND TRANSVAGINAL ULTRASOUND OF PELVIS DOPPLER ULTRASOUND OF OVARIES TECHNIQUE: Both transabdominal and transvaginal ultrasound examinations of the pelvis were performed. Transabdominal technique was performed for global imaging of the pelvis including uterus, ovaries, adnexal regions, and pelvic cul-de-sac. It was necessary to proceed with endovaginal exam following the transabdominal exam to visualize the endometrium and ovaries. Color and duplex Doppler ultrasound was utilized to evaluate blood flow to the ovaries. COMPARISON:  None. FINDINGS: Uterus Measurements: 6.8 x 3.6 x 3.8 cm = volume: 49 mL. No fibroids or other mass visualized. Endometrium Thickness: 4 mm.  No focal abnormality visualized. Right ovary Measurements: Subjectively normal size although difficult to measure due to numerous cysts. Largest cyst measures approximately 3.9 cm in diameter. No solid mass. Left ovary Measurements: 3.0 x 1.6 x 1.7 cm = volume: 4.5 mL. Normal appearance/no adnexal mass. Pulsed Doppler evaluation of both ovaries demonstrates normal low-resistance arterial and venous waveforms. Other findings Small amount of free pelvic fluid. IMPRESSION: 1.  Normal uterus and endometrium. 2. Normal size ovaries with normal symmetric vascular flow. Several right ovarian cysts with the largest measuring 3.9 cm. 3.  Minimal free pelvic fluid likely physiologic. Electronically Signed   By: Elberta Fortis M.D.   On: 08/10/2019 11:27   US Pelvis Complete  Result Date: 08/10/2019 CLINICAL DATA:  Left  lower quadrant pain. EXAM: TRANSABDOMINAL AND TRANSVAGINAL ULTRASOUND OF PELVIS DOPPLER ULTRASOUND OF OVARIES TECHNIQUE: Both transabdominal and transvaginal ultrasound examinations of the pelvis were performed. Transabdominal technique was performed for global imaging of the pelvis including uterus, ovaries, adnexal regions, and pelvic cul-de-sac. It was necessary to proceed with endovaginal exam following the transabdominal exam to visualize the endometrium and ovaries. Color and duplex Doppler ultrasound was utilized to evaluate blood flow to  the ovaries. COMPARISON:  None. FINDINGS: Uterus Measurements: 6.8 x 3.6 x 3.8 cm = volume: 49 mL. No fibroids or other mass visualized. Endometrium Thickness: 4 mm.  No focal abnormality visualized. Right ovary Measurements: Subjectively normal size although difficult to measure due to numerous cysts. Largest cyst measures approximately 3.9 cm in diameter. No solid mass. Left ovary Measurements: 3.0 x 1.6 x 1.7 cm = volume: 4.5 mL. Normal appearance/no adnexal mass. Pulsed Doppler evaluation of both ovaries demonstrates normal low-resistance arterial and venous waveforms. Other findings Small amount of free pelvic fluid. IMPRESSION: 1.  Normal uterus and endometrium. 2. Normal size ovaries with normal symmetric vascular flow. Several right ovarian cysts with the largest measuring 3.9 cm. 3.  Minimal free pelvic fluid likely physiologic. Electronically Signed   By: Elberta Fortisaniel  Boyle M.D.   On: 08/10/2019 11:27   US Art/Ven Flow Abd Pelv Doppler  Result Date: 08/10/2019 CLINICAL DATA:  Left lower quadrant pain. EXAM: TRANSABDOMINAL AND TRANSVAGINAL ULTRASOUND OF PELVIS DOPPLER ULTRASOUND OF OVARIES TECHNIQUE: Both transabdominal and transvaginal ultrasound examinations of the pelvis were performed. Transabdominal technique was performed for global imaging of the pelvis including uterus, ovaries, adnexal regions, and pelvic cul-de-sac. It was necessary to proceed with  endovaginal exam following the transabdominal exam to visualize the endometrium and ovaries. Color and duplex Doppler ultrasound was utilized to evaluate blood flow to the ovaries. COMPARISON:  None. FINDINGS: Uterus Measurements: 6.8 x 3.6 x 3.8 cm = volume: 49 mL. No fibroids or other mass visualized. Endometrium Thickness: 4 mm.  No focal abnormality visualized. Right ovary Measurements: Subjectively normal size although difficult to measure due to numerous cysts. Largest cyst measures approximately 3.9 cm in diameter. No solid mass. Left ovary Measurements: 3.0 x 1.6 x 1.7 cm = volume: 4.5 mL. Normal appearance/no adnexal mass. Pulsed Doppler evaluation of both ovaries demonstrates normal low-resistance arterial and venous waveforms. Other findings Small amount of free pelvic fluid. IMPRESSION: 1.  Normal uterus and endometrium. 2. Normal size ovaries with normal symmetric vascular flow. Several right ovarian cysts with the largest measuring 3.9 cm. 3.  Minimal free pelvic fluid likely physiologic. Electronically Signed   By: Elberta Fortisaniel  Boyle M.D.   On: 08/10/2019 11:27   CT Renal Stone Study  Result Date: 08/10/2019 CLINICAL DATA:  Left flank pain. EXAM: CT ABDOMEN AND PELVIS WITHOUT CONTRAST TECHNIQUE: Multidetector CT imaging of the abdomen and pelvis was performed following the standard protocol without IV contrast. COMPARISON:  None. FINDINGS: Lower chest: Insert lung bases Hepatobiliary: No hepatic lesions are identified without contrast. No intra or extrahepatic biliary dilatation. The gallbladder appears normal. Pancreas: No mass, inflammation or ductal dilatation. Spleen: Normal size. No focal lesions. Adrenals/Urinary Tract: The adrenal glands are normal. No renal, ureteral or bladder calculi or mass is identified. Stomach/Bowel: The stomach, duodenum, small bowel and colon are grossly normal without oral contrast. No inflammatory changes, mass lesions or obstructive findings. The appendix is normal.  Vascular/Lymphatic: The aorta is normal in caliber. No atheroscerlotic calcifications. No mesenteric of retroperitoneal mass or adenopathy. Small scattered lymph nodes are noted. Reproductive: The uterus is unremarkable. There are bilateral ovarian cysts noted. Other: No pelvic mass or adenopathy. No free pelvic fluid collections. No inguinal mass or adenopathy. No abdominal wall hernia or subcutaneous lesions. Musculoskeletal: No significant bony findings. IMPRESSION: 1. No renal, ureteral or bladder calculi or mass. 2. No acute abdominal/pelvic findings, mass lesions or adenopathy. 3. Bilateral ovarian cysts. Electronically Signed   By: Orlene PlumP.  Gallerani M.D.  On: 08/10/2019 12:17    Procedures Procedures (including critical care time)  Medications Ordered in ED Medications  ibuprofen (ADVIL) tablet 600 mg (600 mg Oral Given 08/10/19 1014)  ondansetron (ZOFRAN-ODT) disintegrating tablet 4 mg (4 mg Oral Given 08/10/19 1014)  ketorolac (TORADOL) 30 MG/ML injection 30 mg (30 mg Intravenous Given 08/10/19 1220)  metoCLOPramide (REGLAN) injection 5 mg (5 mg Intravenous Given 08/10/19 1220)    ED Course  I have reviewed the triage vital signs and the nursing notes.  Pertinent labs & imaging results that were available during my care of the patient were reviewed by me and considered in my medical decision making (see chart for details).    MDM Rules/Calculators/A&P                      29 year old female with a history of ovarian cyst presenting for evaluation of left pelvic pain that started suddenly last night that is associated with vaginal bleeding.  On pelvic exam, she does have moderate blood in the vaginal vault and left adnexal tenderness.  The remainder of exam is benign.  Chem-8 with normal hemoglobin, beta hCG negative, UA with hematuria but no signs of infection.  Wet prep with WBCs but no clue cells, doubt BV.  GC/chlamydia is pending.  Patient with low risk for STD, will hold on prophylactic  treatment.  We will get pelvic ultrasound to rule out ovarian torsion, ruptured ovarian cyst.  Will give Motrin for pain.  11:52 AM rechecked pt. She is tearful and c/o persistent pain. Discussed [place] results showing right ovarian cyst. No left ovarian cyst. She continues to have llq/left pelvic ttp. Will get ct renal to r/o stone.   1:29 PM rechecked patient.  She is feeling much improved after Toradol and Reglan.  We discussed the findings of the CT scan showing evidence of ureterolithiasis or other emergent intra-abdominal/pelvic finding.  CT scan did demonstrate evidence of bilateral ovarian cysts.  Discussed that this could be the etiology of her symptoms.  Advised that she can continue ibuprofen.  I will also give Rx for tramadol for breakthrough pain.  I will also give antiemetics.  She has an appointment with her OB/GYN tomorrow and I advised her to keep this.  Advised her to return to the ED for new or worsening symptoms.  She voiced understanding plan reasons to return.  All questions appeared patient stable for discharge.   Final Clinical Impression(s) / ED Diagnoses Final diagnoses:  Bilateral ovarian cysts  Vaginal bleeding    Rx / DC Orders ED Discharge Orders         Ordered    traMADol (ULTRAM) 50 MG tablet  Every 12 hours PRN     08/10/19 [address] Iley Deignan S, PA-C 08/10/19 1333    Bethann Berkshire, MD 08/12/19 [phone]

## 2019-08-10 NOTE — Discharge Instructions (Signed)
You may alternate taking Tylenol and Ibuprofen as needed for pain control. You may take 400-600 mg of ibuprofen every 6 hours and [phone] mg of Tylenol every 6 hours. Do not exceed 4000 mg of Tylenol daily as this can lead to liver damage. Also, make sure to take Ibuprofen with meals as it can cause an upset stomach. Do not take other NSAIDs while taking Ibuprofen such as (Aleve, Naprosyn, Aspirin, Celebrex, etc) and do not take more than the prescribed dose as this can lead to ulcers and bleeding in your GI tract. You may use warm and cold compresses to help with your symptoms.   Prescription given for Tramadol. Take medication as directed and do not operate machinery, drive a car, or work while taking this medication as it can make you drowsy.    Please follow up with your  OB-GYN for re-evaluation and further treatment of your symptoms.   Please return to the ER sooner if you have any new or worsening symptoms.

## 2019-08-10 NOTE — ED Notes (Signed)
IV removed from left AC. 

## 2019-08-10 NOTE — ED Triage Notes (Addendum)
Per pt, states she has increased bleeding with menstrual cycle-started last night-has appointment with GYN tomorrow but was told to come to ED due to her severe pain-states she has ovarian cysts which causes pain-states changing 3 pads within 2 hours

## 2019-08-11 LAB — GC/CHLAMYDIA PROBE AMP (~~LOC~~) NOT AT ARMC
Chlamydia: NEGATIVE
Comment: NEGATIVE
Comment: NORMAL
Neisseria Gonorrhea: NEGATIVE

## 2019-10-14 ENCOUNTER — Other Ambulatory Visit: Payer: Self-pay

## 2019-10-14 ENCOUNTER — Ambulatory Visit (INDEPENDENT_AMBULATORY_CARE_PROVIDER_SITE_OTHER): Payer: Managed Care, Other (non HMO) | Admitting: Neurology

## 2019-10-14 DIAGNOSIS — G43709 Chronic migraine without aura, not intractable, without status migrainosus: Secondary | ICD-10-CM

## 2019-10-14 MED ORDER — ONABOTULINUMTOXINA 100 UNITS IJ SOLR
160.0000 [IU] | Freq: Once | INTRAMUSCULAR | Status: AC
  Administered 2019-10-14: 155 [IU] via INTRAMUSCULAR

## 2019-10-14 NOTE — Progress Notes (Signed)
[facility]  ° °Procedure Note Botox ° °Attending: Dr. Gena Laski ° °Preoperative Diagnosis(es): Chronic migraine ° °Consent obtained from: The patient °Benefits discussed included, but were not limited to decreased muscle tightness, increased joint range of motion, and decreased pain.  Risk discussed included, but were not limited pain and discomfort, bleeding, bruising, excessive weakness, venous thrombosis, muscle atrophy and dysphagia.  Anticipated outcomes of the procedure as well as he risks and benefits of the alternatives to the procedure, and the roles and tasks of the personnel to be involved, were discussed with the patient, and the patient consents to the procedure and agrees to proceed. A copy of the patient medication guide was given to the patient which explains the blackbox warning. ° °Patients identity and treatment sites confirmed Yes.  . ° °Details of Procedure: °Skin was cleaned with alcohol. Prior to injection, the needle plunger was aspirated to make sure the needle was not within a blood vessel.  There was no blood retrieved on aspiration.   ° °Following is a summary of the muscles injected  And the amount of Botulinum toxin used: ° °Dilution °200 units of Botox was reconstituted with 4 ml of preservative free normal saline. °Time of reconstitution: At the time of the office visit (<30 minutes prior to injection)  ° °Injections  °155 total units of Botox was injected with a 30 gauge needle. ° °Injection Sites: °L occipitalis: 15 units- 3 sites  °R occiptalis: 15 units- 3 sites ° °L upper trapezius: 15 units- 3 sites °R upper trapezius: 15 units- 3 sits          °L paraspinal: 10 units- 2 sites °R paraspinal: 10 units- 2 sites ° °Face °L frontalis(2 injection sites):10 units   °R frontalis(2 injection sites):10 units         °L corrugator: 5 units   °R corrugator: 5 units           °Procerus: 5 units   °L temporalis: 20 units °R temporalis: 20 units  ° °Agent:  °200 units of botulinum Type  A (Onobotulinum Toxin type A) was reconstituted with 4 ml of preservative free normal saline.  °Time of reconstitution: At the time of the office visit (<30 minutes prior to injection)  ° ° ° Total injected (Units):  155 ° Total wasted (Units):  5 ° °Patient tolerated procedure well without complications.   °Reinjection is anticipated in 3 months. ° ° °

## 2020-01-13 ENCOUNTER — Ambulatory Visit: Payer: Managed Care, Other (non HMO) | Admitting: Neurology

## 2020-01-19 ENCOUNTER — Ambulatory Visit: Payer: Managed Care, Other (non HMO) | Admitting: Neurology

## 2020-01-19 NOTE — Progress Notes (Signed)
Need to change patient to Kayla Owens. Called them and set up her account as well as gave verbal script.

## 2020-01-20 ENCOUNTER — Encounter: Payer: Self-pay | Admitting: Neurology

## 2020-01-20 NOTE — Progress Notes (Signed)
Shanielle Crispo Key: [id number] - PA Case ID: [id number] - Rx #: [id number] Need help? Call us at [phone] Status Sent to [facility] October 14 Drug Botox 200UNIT solution Form [facility]at[facility] Specking Electronic Prior Authorization Request Form

## 2020-01-23 NOTE — Progress Notes (Addendum)
Received appeal paperwork so I faxed back with more clinicals for Botox.   Botox 200 units is approved valid from 01/24/20 to 01/23/21. Claim #:[id number].   10/29- spoke with patient to let her know to call [facility] to give consent for delivery.

## 2020-02-17 ENCOUNTER — Ambulatory Visit: Payer: Managed Care, Other (non HMO) | Admitting: Neurology

## 2020-03-16 ENCOUNTER — Other Ambulatory Visit: Payer: Self-pay

## 2020-03-16 ENCOUNTER — Ambulatory Visit (INDEPENDENT_AMBULATORY_CARE_PROVIDER_SITE_OTHER): Payer: Managed Care, Other (non HMO) | Admitting: Neurology

## 2020-03-16 DIAGNOSIS — G43709 Chronic migraine without aura, not intractable, without status migrainosus: Secondary | ICD-10-CM

## 2020-03-16 MED ORDER — ONABOTULINUMTOXINA 100 UNITS IJ SOLR
200.0000 [IU] | Freq: Once | INTRAMUSCULAR | Status: AC
  Administered 2020-03-16: 155 [IU] via INTRAMUSCULAR

## 2020-03-16 NOTE — Progress Notes (Signed)
[facility]  ° °Procedure Note Botox ° °Attending: Dr. Shalandria Elsbernd ° °Preoperative Diagnosis(es): Chronic migraine ° °Consent obtained from: The patient °Benefits discussed included, but were not limited to decreased muscle tightness, increased joint range of motion, and decreased pain.  Risk discussed included, but were not limited pain and discomfort, bleeding, bruising, excessive weakness, venous thrombosis, muscle atrophy and dysphagia.  Anticipated outcomes of the procedure as well as he risks and benefits of the alternatives to the procedure, and the roles and tasks of the personnel to be involved, were discussed with the patient, and the patient consents to the procedure and agrees to proceed. A copy of the patient medication guide was given to the patient which explains the blackbox warning. ° °Patients identity and treatment sites confirmed Yes.  . ° °Details of Procedure: °Skin was cleaned with alcohol. Prior to injection, the needle plunger was aspirated to make sure the needle was not within a blood vessel.  There was no blood retrieved on aspiration.   ° °Following is a summary of the muscles injected  And the amount of Botulinum toxin used: ° °Dilution °200 units of Botox was reconstituted with 4 ml of preservative free normal saline. °Time of reconstitution: At the time of the office visit (<30 minutes prior to injection)  ° °Injections  °155 total units of Botox was injected with a 30 gauge needle. ° °Injection Sites: °L occipitalis: 15 units- 3 sites  °R occiptalis: 15 units- 3 sites ° °L upper trapezius: 15 units- 3 sites °R upper trapezius: 15 units- 3 sits          °L paraspinal: 10 units- 2 sites °R paraspinal: 10 units- 2 sites ° °Face °L frontalis(2 injection sites):10 units   °R frontalis(2 injection sites):10 units         °L corrugator: 5 units   °R corrugator: 5 units           °Procerus: 5 units   °L temporalis: 20 units °R temporalis: 20 units  ° °Agent:  °200 units of botulinum Type  A (Onobotulinum Toxin type A) was reconstituted with 4 ml of preservative free normal saline.  °Time of reconstitution: At the time of the office visit (<30 minutes prior to injection)  ° ° ° Total injected (Units):  155 ° Total wasted (Units):  45 ° °Patient tolerated procedure well without complications.   °Reinjection is anticipated in 3 months. ° ° °

## 2020-03-27 ENCOUNTER — Other Ambulatory Visit: Payer: Self-pay

## 2020-03-27 MED ORDER — ZOLMITRIPTAN 5 MG NA SOLN: NASAL | 1 refills | Status: DC

## 2020-04-17 NOTE — Progress Notes (Deleted)
NEUROLOGY FOLLOW UP OFFICE NOTE  Kayla Owens [id number]   Subjective:  Kayla Owens a 30 year old woman with asthma and depression and history of viral meningitis (06/2018) who follows up for migraines.  UPDATE: Intensity:  *** Duration:  *** Frequency:  *** Rescue protocol: Takes an ibuprofen usually. Current NSAIDS:ibuprofen Current analgesics:Tylenol, Excedrin Current triptans:Zomig 5 mg NS (it worked within 15 minutes) Current ergotamine:None Current anti-emetic:None Current muscle relaxants:None Current anti-anxiolytic:None Current sleep aide:None Current Antihypertensive medications:None Current Antidepressant medications:Lexapro 10mg  Current Anticonvulsant medications:None Current anti-CGRP:None Current Vitamins/Herbal/Supplements:Multivitamin, fish oil Current Antihistamines/Decongestants:None Other therapy:Botox Hormone/birth control: None  Caffeine:No Diet:Hydrates, no soda Exercise:Yes Depression:Yes but controlled; Anxiety:Yes but controlled Other pain:No Sleep hygiene:Varies  HISTORY: Onset:  30 years old Location:Frontal/bi-temporal,sometimes occipital as well Quality:Pounding/pressure Initial intensity:10/10 Aura:Sometimes preceded by blindspots in her vision Prodrome:no Postdrome:no Associated symptoms: Nauseavomiting, photophobia, phonophobia, osmophobia. She has not had any new worse headache of her life, waking up from sleep Initial duration:At least 2 days InitialFrequency:16 headache days per month for several years Triggers:  None Relieving factors:  Rubbing temples, laying in dark quiet cool room Activity:Cannot function 4 days per month.  Past NSAIDS: Aleve, diclofenac, Mobic, toradol Past analgesics: acetaminophen, Goody, BC, Excedrin Past abortive triptans:Sumatriptan tablet, Maxalt Past muscle relaxants: tizanidine, cyclobenzaprine Past anti-emetic: promethazine Past  antihypertensive medications:no Past antidepressant medications: Amitriptyline, nortriptyline, citalopram Past anticonvulsant medications:topiramate Past vitamins/Herbal/Supplements:no Other past therapies:no  Family history of headache/migraine: mother, maternal grandmother  Reportedly had MRI of brain in 2009 which was negative.  PAST MEDICAL HISTORY: Past Medical History:  Diagnosis Date  . Anxiety   . Asthma   . Bilateral ovarian cysts   . Depression   . Intractable chronic migraine without aura    neurologist-- dr 2010 (receives botox treatment)    MEDICATIONS: Current Outpatient Medications on File Prior to Visit  Medication Sig Dispense Refill  . albuterol (PROVENTIL HFA;VENTOLIN HFA) 108 (90 Base) MCG/ACT inhaler Inhale 1-2 puffs into the lungs every 6 (six) hours as needed for wheezing or shortness of breath. 1 Inhaler 3  . beclomethasone (QVAR) 40 MCG/ACT inhaler Inhale 2 puffs into the lungs daily. (Patient taking differently: Inhale 2 puffs into the lungs 2 (two) times daily. ) 1 Inhaler 3  . DULoxetine (CYMBALTA) 30 MG capsule Take 30 mg by mouth daily.    . montelukast (SINGULAIR) 10 MG tablet Take 1 tablet (10 mg total) by mouth at bedtime. 90 tablet 1  . OnabotulinumtoxinA (BOTOX IJ) Inject 1 each as directed every 3 (three) months.    . zolmitriptan (ZOMIG) 5 MG nasal solution 1 spray in one nostril at earliest onset of migraine.  May repeat dose x1 in 2h 1 each 1   No current facility-administered medications on file prior to visit.    ALLERGIES: No Known Allergies  FAMILY HISTORY: Family History  Problem Relation Age of Onset  . Arthritis Mother   . Hypertension Mother   . Lupus Mother   . Heart disease Maternal Grandmother   . Heart disease Maternal Grandfather   . Hypertension Maternal Grandfather   . Arthritis Paternal Grandmother   . Stomach cancer Paternal Grandfather   . Ovarian cancer Maternal Aunt   . Ovarian cancer  Maternal Aunt     SOCIAL HISTORY: Social History   Socioeconomic History  . Marital status: Single    Spouse name: Not on file  . Number of children: 0  . Years of education: 46  . Highest education level: Not on file  Occupational History  . Occupation: RMA    Employer: [employer]  Tobacco Use  . Smoking status: Current Every Day Smoker    Types: Cigarettes  . Smokeless tobacco: Never Used  . Tobacco comment: smokes black and mild cigars QOD, patient states two a day  Vaping Use  . Vaping Use: Never used  Substance and Sexual Activity  . Alcohol use: No  . Drug use: No  . Sexual activity: Yes  Other Topics Concern  . Not on file  Social History Narrative   Fun/Hobby: Swimming / pool   Single, lives with boyfriend, avoids caffeine. Exercises 2x a week   Right handed   Social Determinants of Health   Financial Resource Strain: Not on file  Food Insecurity: Not on file  Transportation Needs: Not on file  Physical Activity: Not on file  Stress: Not on file  Social Connections: Not on file  Intimate Partner Violence: Not on file     Objective:  *** General: No acute distress.  Patient appears well-groomed.   Head:  Normocephalic/atraumatic Eyes:  Fundi examined but not visualized Neck: supple, no paraspinal tenderness, full range of motion Heart:  Regular rate and rhythm Lungs:  Clear to auscultation bilaterally Back: No paraspinal tenderness Neurological Exam: alert and oriented to person, place, and time. Attention span and concentration intact, recent and remote memory intact, fund of knowledge intact.  Speech fluent and not dysarthric, language intact.  CN II-XII intact. Bulk and tone normal, muscle strength 5/5 throughout.  Sensation to light touch, temperature and vibration intact.  Deep tendon reflexes 2+ throughout, toes downgoing.  Finger to nose and heel to shin testing intact.  Gait normal, Romberg negative.   Assessment/Plan:   Migraine without aura,  without status migrainosus, not intractable  1.  Migraine prevention:  Botox 2.  Migraine rescue:  Zomig 5mg  NS 3.  Limit use of pain relievers to no more than 2 days out of week to prevent risk of rebound or medication-overuse headache. 4.  Keep headache diary 5.  Follow up ***  , DO  CC: Shon Millet, PA-C

## 2020-04-19 ENCOUNTER — Ambulatory Visit: Payer: Managed Care, Other (non HMO) | Admitting: Neurology

## 2020-05-18 ENCOUNTER — Ambulatory Visit: Payer: Managed Care, Other (non HMO) | Admitting: Neurology

## 2020-06-22 ENCOUNTER — Other Ambulatory Visit: Payer: Self-pay

## 2020-06-22 ENCOUNTER — Ambulatory Visit (INDEPENDENT_AMBULATORY_CARE_PROVIDER_SITE_OTHER): Payer: Managed Care, Other (non HMO) | Admitting: Neurology

## 2020-06-22 DIAGNOSIS — G43709 Chronic migraine without aura, not intractable, without status migrainosus: Secondary | ICD-10-CM | POA: Diagnosis not present

## 2020-06-22 DIAGNOSIS — G43009 Migraine without aura, not intractable, without status migrainosus: Secondary | ICD-10-CM | POA: Diagnosis not present

## 2020-06-22 MED ORDER — ONABOTULINUMTOXINA 100 UNITS IJ SOLR
200.0000 [IU] | Freq: Once | INTRAMUSCULAR | Status: AC
  Administered 2020-06-22: 155 [IU] via INTRAMUSCULAR

## 2020-06-22 NOTE — Progress Notes (Signed)
[facility]   Procedure Note Botox  Attending: Dr. Storm Dulski  Preoperative Diagnosis(es): Chronic migraine  Consent obtained from: The patient Benefits discussed included, but were not limited to decreased muscle tightness, increased joint range of motion, and decreased pain.  Risk discussed included, but were not limited pain and discomfort, bleeding, bruising, excessive weakness, venous thrombosis, muscle atrophy and dysphagia.  Anticipated outcomes of the procedure as well as he risks and benefits of the alternatives to the procedure, and the roles and tasks of the personnel to be involved, were discussed with the patient, and the patient consents to the procedure and agrees to proceed. A copy of the patient medication guide was given to the patient which explains the blackbox warning.  Patients identity and treatment sites confirmed Yes.  .  Details of Procedure: Skin was cleaned with alcohol. Prior to injection, the needle plunger was aspirated to make sure the needle was not within a blood vessel.  There was no blood retrieved on aspiration.    Following is a summary of the muscles injected  And the amount of Botulinum toxin used:  Dilution 200 units of Botox was reconstituted with 4 ml of preservative free normal saline. Time of reconstitution: At the time of the office visit (<30 minutes prior to injection)   Injections  155 total units of Botox was injected with a 30 gauge needle.  Injection Sites: L occipitalis: 15 units- 3 sites  R occiptalis: 15 units- 3 sites  L upper trapezius: 15 units- 3 sites R upper trapezius: 15 units- 3 sits          L paraspinal: 10 units- 2 sites R paraspinal: 10 units- 2 sites  Face L frontalis(2 injection sites):10 units   R frontalis(2 injection sites):10 units         L corrugator: 5 units   R corrugator: 5 units           Procerus: 5 units   L temporalis: 20 units R temporalis: 20 units   Agent:  200 units of botulinum Type  A (Onobotulinum Toxin type A) was reconstituted with 4 ml of preservative free normal saline.  Time of reconstitution: At the time of the office visit (<30 minutes prior to injection)     Total injected (Units): 155  Total wasted (Units): none wasted  Patient tolerated procedure well without complications.   Reinjection is anticipated in 3 months.    

## 2020-07-06 DIAGNOSIS — Z0279 Encounter for issue of other medical certificate: Secondary | ICD-10-CM

## 2020-07-13 ENCOUNTER — Encounter: Payer: Self-pay | Admitting: Neurology

## 2020-08-08 ENCOUNTER — Other Ambulatory Visit: Payer: Self-pay | Admitting: Neurology

## 2020-08-08 MED ORDER — ZOLMITRIPTAN 5 MG NA SOLN: NASAL | 5 refills | Status: DC

## 2020-09-21 ENCOUNTER — Other Ambulatory Visit: Payer: Self-pay

## 2020-09-21 ENCOUNTER — Ambulatory Visit: Payer: Managed Care, Other (non HMO) | Admitting: Neurology

## 2020-09-21 DIAGNOSIS — G43709 Chronic migraine without aura, not intractable, without status migrainosus: Secondary | ICD-10-CM | POA: Diagnosis not present

## 2020-09-21 MED ORDER — ONABOTULINUMTOXINA 100 UNITS IJ SOLR
200.0000 [IU] | Freq: Once | INTRAMUSCULAR | Status: AC
  Administered 2020-09-21: 155 [IU] via INTRAMUSCULAR

## 2020-09-21 NOTE — Progress Notes (Signed)
[facility]   Procedure Note Botox  Attending: Dr. Shon Millet  Preoperative Diagnosis(es): Chronic migraine  Consent obtained from: The patient Benefits discussed included, but were not limited to decreased muscle tightness, increased joint range of motion, and decreased pain.  Risk discussed included, but were not limited pain and discomfort, bleeding, bruising, excessive weakness, venous thrombosis, muscle atrophy and dysphagia.  Anticipated outcomes of the procedure as well as he risks and benefits of the alternatives to the procedure, and the roles and tasks of the personnel to be involved, were discussed with the patient, and the patient consents to the procedure and agrees to proceed. A copy of the patient medication guide was given to the patient which explains the blackbox warning.  Patients identity and treatment sites confirmed Yes.  .  Details of Procedure: Skin was cleaned with alcohol. Prior to injection, the needle plunger was aspirated to make sure the needle was not within a blood vessel.  There was no blood retrieved on aspiration.    Following is a summary of the muscles injected  And the amount of Botulinum toxin used:  Dilution 200 units of Botox was reconstituted with 4 ml of preservative free normal saline. Time of reconstitution: At the time of the office visit (<30 minutes prior to injection)   Injections  155 total units of Botox was injected with a 30 gauge needle.  Injection Sites: L occipitalis: 15 units- 3 sites  R occiptalis: 15 units- 3 sites  L upper trapezius: 15 units- 3 sites R upper trapezius: 15 units- 3 sits          L paraspinal: 10 units- 2 sites R paraspinal: 10 units- 2 sites  Face L frontalis(2 injection sites):10 units   R frontalis(2 injection sites):10 units         L corrugator: 5 units   R corrugator: 5 units           Procerus: 5 units   L temporalis: 20 units R temporalis: 20 units   Agent:  200 units of botulinum Type  A (Onobotulinum Toxin type A) was reconstituted with 4 ml of preservative free normal saline.  Time of reconstitution: At the time of the office visit (<30 minutes prior to injection)     Total injected (Units):  155  Total wasted (Units):  none wasted  Patient tolerated procedure well without complications.   Reinjection is anticipated in 3 months. Return to clinic in 2 months

## 2020-11-20 ENCOUNTER — Other Ambulatory Visit: Payer: Self-pay

## 2020-11-20 DIAGNOSIS — G43709 Chronic migraine without aura, not intractable, without status migrainosus: Secondary | ICD-10-CM

## 2020-11-20 MED ORDER — BOTOX 200 UNITS IJ SOLR: INTRAMUSCULAR | 4 refills | Status: DC

## 2020-12-11 ENCOUNTER — Telehealth: Payer: Self-pay | Admitting: *Deleted

## 2020-12-11 NOTE — Telephone Encounter (Signed)
VM stating to call [facility] or go online if that is how she contacts them.  Give consent for shipment. We are trying to get all Botox the week before the appointment so there are no appointment cancellations, this gives everyone time to fix any problems of there are any. The number is [phone]

## 2020-12-21 ENCOUNTER — Other Ambulatory Visit: Payer: Self-pay

## 2020-12-21 ENCOUNTER — Ambulatory Visit (INDEPENDENT_AMBULATORY_CARE_PROVIDER_SITE_OTHER): Payer: Managed Care, Other (non HMO) | Admitting: Neurology

## 2020-12-21 DIAGNOSIS — G43709 Chronic migraine without aura, not intractable, without status migrainosus: Secondary | ICD-10-CM | POA: Diagnosis not present

## 2020-12-21 MED ORDER — ONABOTULINUMTOXINA 100 UNITS IJ SOLR
200.0000 [IU] | Freq: Once | INTRAMUSCULAR | Status: AC
Start: 2020-12-21 — End: 2020-12-21
  Administered 2020-12-21: 155 [IU] via INTRAMUSCULAR

## 2020-12-21 NOTE — Progress Notes (Signed)
[facility]   Procedure Note Botox  Attending: Dr. Siddiq Kaluzny  Preoperative Diagnosis(es): Chronic migraine  Consent obtained from: The patient Benefits discussed included, but were not limited to decreased muscle tightness, increased joint range of motion, and decreased pain.  Risk discussed included, but were not limited pain and discomfort, bleeding, bruising, excessive weakness, venous thrombosis, muscle atrophy and dysphagia.  Anticipated outcomes of the procedure as well as he risks and benefits of the alternatives to the procedure, and the roles and tasks of the personnel to be involved, were discussed with the patient, and the patient consents to the procedure and agrees to proceed. A copy of the patient medication guide was given to the patient which explains the blackbox warning.  Patients identity and treatment sites confirmed Yes.  .  Details of Procedure: Skin was cleaned with alcohol. Prior to injection, the needle plunger was aspirated to make sure the needle was not within a blood vessel.  There was no blood retrieved on aspiration.    Following is a summary of the muscles injected  And the amount of Botulinum toxin used:  Dilution 200 units of Botox was reconstituted with 4 ml of preservative free normal saline. Time of reconstitution: At the time of the office visit (<30 minutes prior to injection)   Injections  155 total units of Botox was injected with a 30 gauge needle.  Injection Sites: L occipitalis: 15 units- 3 sites  R occiptalis: 15 units- 3 sites  L upper trapezius: 15 units- 3 sites R upper trapezius: 15 units- 3 sits          L paraspinal: 10 units- 2 sites R paraspinal: 10 units- 2 sites  Face L frontalis(2 injection sites):10 units   R frontalis(2 injection sites):10 units         L corrugator: 5 units   R corrugator: 5 units           Procerus: 5 units   L temporalis: 20 units R temporalis: 20 units   Agent:  200 units of botulinum Type  A (Onobotulinum Toxin type A) was reconstituted with 4 ml of preservative free normal saline.  Time of reconstitution: At the time of the office visit (<30 minutes prior to injection)     Total injected (Units): 155  Total wasted (Units): none wasted  Patient tolerated procedure well without complications.   Reinjection is anticipated in 3 months.    

## 2021-01-15 ENCOUNTER — Telehealth: Payer: Self-pay

## 2021-01-15 NOTE — Telephone Encounter (Signed)
New message   Benefit Verification initiated under Botox one     Benefit Verification [facility] Submitted!

## 2021-01-30 NOTE — Telephone Encounter (Addendum)
New message   Prior authorization initiate over the phone spoke with [place] at [facility] [phone].   Medication Botox 200

## 2021-03-15 ENCOUNTER — Telehealth: Payer: Self-pay | Admitting: Neurology

## 2021-03-15 NOTE — Telephone Encounter (Signed)
Patient would like to know if she is getting botox. She received a message from [facility] stating it was delivered. She thought she wasn't supposed to get it. Can someone call  her or you can utilize the my chart [phone]

## 2021-03-15 NOTE — Telephone Encounter (Signed)
Advised pt to keep f/u appt so we will new notes for the insurance in the new year.

## 2021-03-22 ENCOUNTER — Encounter: Payer: Self-pay | Admitting: Neurology

## 2021-03-22 ENCOUNTER — Other Ambulatory Visit: Payer: Self-pay

## 2021-03-22 ENCOUNTER — Ambulatory Visit (INDEPENDENT_AMBULATORY_CARE_PROVIDER_SITE_OTHER): Payer: Managed Care, Other (non HMO) | Admitting: Neurology

## 2021-03-22 VITALS — BP 111/63 | HR 86 | Ht 67.0 in | Wt 223.8 lb

## 2021-03-22 DIAGNOSIS — G43009 Migraine without aura, not intractable, without status migrainosus: Secondary | ICD-10-CM

## 2021-03-22 MED ORDER — ONDANSETRON 4 MG PO TBDP: 4.0000 mg | ORAL_TABLET | Freq: Three times a day (TID) | ORAL | 5 refills | Status: DC | PRN

## 2021-03-22 NOTE — Progress Notes (Signed)
NEUROLOGY FOLLOW UP OFFICE NOTE  Kayla Owens [id number]  Assessment/Plan:   Migraine without aura, without status migrainosus, not intractable  Migraine prevention:  Botox Migraine rescue:  Zomig 5mg  NS with ibuprofen and Tylenol.  Will prescribe Zofran ODT 4mg  Limit use of pain relievers to no more than 2 days out of week to prevent risk of rebound or medication-overuse headache. Keep headache diary Follow up one year.   Subjective:  Kayla Owens is a 30 year old woman with asthma and depression and history of viral meningitis (06/2018) who follows up for migraines.   UPDATE: Intensity:  10/10 Duration:  15 minutes with treatment Frequency:  3 times a month  Rescue protocol:  Zomig 5mg  NS, 1/2 Tylenol, ibuprofen 400mg  Current NSAIDS: ibuprofen Current analgesics: Tylenol, Excedrin Current triptans: Zomig 5 mg NS (it worked within 15 minutes) Current ergotamine: None Current anti-emetic: None Current muscle relaxants: None Current anti-anxiolytic: None Current sleep aide: None Current Antihypertensive medications: None Current Antidepressant medications: Lexapro 10mg  Current Anticonvulsant medications: None Current anti-CGRP: None Current Vitamins/Herbal/Supplements: Multivitamin, fish oil Current Antihistamines/Decongestants: None Other therapy: Botox Hormone/birth control: None       Caffeine: No Diet: Hydrates, no soda Exercise: Yes Depression: Yes but controlled; Anxiety: Yes but controlled Other pain: No Sleep hygiene: Varies   HISTORY: Onset:  30 years old Location:  Frontal/bi-temporal,sometimes occipital as well Quality:  Pounding/pressure Initial intensity:  10/10 Aura:  Sometimes preceded by blindspots in her vision Prodrome:  no Postdrome:  no Associated symptoms: Nausea vomiting, photophobia, phonophobia, osmophobia.  She has not had any new worse headache of her life, waking up from sleep Initial duration:  At least 2 days Initial  Frequency:  16 headache days per month for several years Triggers:  None Relieving factors:  Rubbing temples, laying in dark quiet cool room Activity:  Cannot function 4 days per month.   Past NSAIDS:  Aleve, diclofenac, Mobic, toradol Past analgesics:  acetaminophen, Goody, BC, Excedrin Past abortive triptans:  Sumatriptan tablet, Maxalt Past muscle relaxants:  tizanidine, cyclobenzaprine Past anti-emetic:  promethazine Past antihypertensive medications:  no Past antidepressant medications:  Amitriptyline, nortriptyline, citalopram Past anticonvulsant medications:  topiramate Past vitamins/Herbal/Supplements:  no Other past therapies:  no   Family history of headache/migraine:  mother, maternal grandmother   Reportedly had MRI of brain in 2009 which was negative.  PAST MEDICAL HISTORY: Past Medical History:  Diagnosis Date   Anxiety    Asthma    Bilateral ovarian cysts    Depression    Intractable chronic migraine without aura    neurologist-- dr (receives botox treatment)    MEDICATIONS: Current Outpatient Medications on File Prior to Visit  Medication Sig Dispense Refill   albuterol (PROVENTIL HFA;VENTOLIN HFA) 108 (90 Base) MCG/ACT inhaler Inhale 1-2 puffs into the lungs every 6 (six) hours as needed for wheezing or shortness of breath. 1 Inhaler 3   beclomethasone (QVAR) 40 MCG/ACT inhaler Inhale 2 puffs into the lungs daily. (Patient taking differently: Inhale 2 puffs into the lungs 2 (two) times daily. ) 1 Inhaler 3   Botulinum Toxin Type A (BOTOX) 200 units SOLR Inject 155 units IM into the multiple sites of head,neck and face every 90 days 1 each 4   DULoxetine (CYMBALTA) 30 MG capsule Take 30 mg by mouth daily.     montelukast (SINGULAIR) 10 MG tablet Take 1 tablet (10 mg total) by mouth at bedtime. 90 tablet 1   OnabotulinumtoxinA (BOTOX IJ) Inject 1 each as directed  every 3 (three) months.     zolmitriptan (ZOMIG) 5 MG nasal solution 1 spray in one  nostril at earliest onset of migraine.  May repeat dose x1 in 2h 1 each 5   No current facility-administered medications on file prior to visit.    ALLERGIES: No Known Allergies  FAMILY HISTORY: Family History  Problem Relation Age of Onset   Arthritis Mother    Hypertension Mother    Lupus Mother    Heart disease Maternal Grandmother    Heart disease Maternal Grandfather    Hypertension Maternal Grandfather    Arthritis Paternal Grandmother    Stomach cancer Paternal Grandfather    Ovarian cancer Maternal Aunt    Ovarian cancer Maternal Aunt       Objective:  Blood pressure 111/63, pulse 86, height 5\' 7"  (1.702 m), weight 223 lb 12.8 oz (101.5 kg), SpO2 100 %. General: No acute distress.  Patient appears well-groomed.   Head:  Normocephalic/atraumatic Eyes:  Fundi examined but not visualized Neck: supple, no paraspinal tenderness, full range of motion Heart:  Regular rate and rhythm Lungs:  Clear to auscultation bilaterally Back: No paraspinal tenderness Neurological Exam: alert and oriented to person, place, and time.  Speech fluent and not dysarthric, language intact.  CN II-XII intact. Bulk and tone normal, muscle strength 5/5 throughout.  Sensation to light touch intact.  Deep tendon reflexes 2+ throughout, toes downgoing.  Finger to nose testing intact.  Gait normal, Romberg negative.   , DO  CC: Shon Millet, PA-C

## 2021-04-12 ENCOUNTER — Ambulatory Visit: Payer: Managed Care, Other (non HMO) | Admitting: Neurology

## 2021-04-16 ENCOUNTER — Telehealth: Payer: Self-pay | Admitting: Neurology

## 2021-04-16 NOTE — Telephone Encounter (Signed)
error 

## 2021-04-23 ENCOUNTER — Ambulatory Visit: Payer: Managed Care, Other (non HMO) | Admitting: Neurology

## 2021-04-24 ENCOUNTER — Other Ambulatory Visit: Payer: Self-pay

## 2021-04-24 ENCOUNTER — Ambulatory Visit: Payer: Managed Care, Other (non HMO) | Admitting: Neurology

## 2021-04-24 DIAGNOSIS — G43709 Chronic migraine without aura, not intractable, without status migrainosus: Secondary | ICD-10-CM

## 2021-04-24 MED ORDER — ONABOTULINUMTOXINA 100 UNITS IJ SOLR
200.0000 [IU] | Freq: Once | INTRAMUSCULAR | Status: AC
  Administered 2021-04-24: 155 [IU] via INTRAMUSCULAR

## 2021-04-24 NOTE — Progress Notes (Signed)
[facility]  ° °Procedure Note Botox ° °Attending: Dr. Finlee Milo ° °Preoperative Diagnosis(es): Chronic migraine ° °Consent obtained from: The patient °Benefits discussed included, but were not limited to decreased muscle tightness, increased joint range of motion, and decreased pain.  Risk discussed included, but were not limited pain and discomfort, bleeding, bruising, excessive weakness, venous thrombosis, muscle atrophy and dysphagia.  Anticipated outcomes of the procedure as well as he risks and benefits of the alternatives to the procedure, and the roles and tasks of the personnel to be involved, were discussed with the patient, and the patient consents to the procedure and agrees to proceed. A copy of the patient medication guide was given to the patient which explains the blackbox warning. ° °Patients identity and treatment sites confirmed Yes.  . ° °Details of Procedure: °Skin was cleaned with alcohol. Prior to injection, the needle plunger was aspirated to make sure the needle was not within a blood vessel.  There was no blood retrieved on aspiration.   ° °Following is a summary of the muscles injected  And the amount of Botulinum toxin used: ° °Dilution °200 units of Botox was reconstituted with 4 ml of preservative free normal saline. °Time of reconstitution: At the time of the office visit (<30 minutes prior to injection)  ° °Injections  °155 total units of Botox was injected with a 30 gauge needle. ° °Injection Sites: °L occipitalis: 15 units- 3 sites  °R occiptalis: 15 units- 3 sites ° °L upper trapezius: 15 units- 3 sites °R upper trapezius: 15 units- 3 sits          °L paraspinal: 10 units- 2 sites °R paraspinal: 10 units- 2 sites ° °Face °L frontalis(2 injection sites):10 units   °R frontalis(2 injection sites):10 units         °L corrugator: 5 units   °R corrugator: 5 units           °Procerus: 5 units   °L temporalis: 20 units °R temporalis: 20 units  ° °Agent:  °200 units of botulinum Type  A (Onobotulinum Toxin type A) was reconstituted with 4 ml of preservative free normal saline.  °Time of reconstitution: At the time of the office visit (<30 minutes prior to injection)  ° ° ° Total injected (Units):  155 ° Total wasted (Units):  45 ° °Patient tolerated procedure well without complications.   °Reinjection is anticipated in 3 months. ° ° °

## 2021-05-15 ENCOUNTER — Telehealth: Payer: Self-pay

## 2021-05-15 NOTE — Telephone Encounter (Signed)
New message   Benefit Verification [id number] Submitted! For BV Basic submissions, please allow 1 business day for results.  For BV Full submissions, please allow 2 business days for results.

## 2021-05-31 ENCOUNTER — Telehealth: Payer: Self-pay

## 2021-05-31 NOTE — Telephone Encounter (Signed)
New message   Initiate prior authorization over the phone  [employer] spoke with Shanca at [phone]  Botox 200   24-72 hours turnaround

## 2021-06-05 NOTE — Telephone Encounter (Signed)
F/u  Received fax from [employer]  Requesting additional information needed by 07/19/2021  Fax back to [phone]

## 2021-07-19 ENCOUNTER — Ambulatory Visit: Payer: Managed Care, Other (non HMO) | Admitting: Neurology

## 2021-07-22 DIAGNOSIS — Z0279 Encounter for issue of other medical certificate: Secondary | ICD-10-CM

## 2021-07-26 ENCOUNTER — Ambulatory Visit (INDEPENDENT_AMBULATORY_CARE_PROVIDER_SITE_OTHER): Payer: Managed Care, Other (non HMO) | Admitting: Neurology

## 2021-07-26 DIAGNOSIS — G43009 Migraine without aura, not intractable, without status migrainosus: Secondary | ICD-10-CM | POA: Diagnosis not present

## 2021-07-26 MED ORDER — ONABOTULINUMTOXINA 100 UNITS IJ SOLR
200.0000 [IU] | Freq: Once | INTRAMUSCULAR | Status: AC
  Administered 2021-07-26: 155 [IU] via INTRAMUSCULAR

## 2021-07-26 NOTE — Progress Notes (Signed)
[facility]  ° °Procedure Note Botox ° °Attending: Dr. Tajai Ihde ° °Preoperative Diagnosis(es): Chronic migraine ° °Consent obtained from: The patient °Benefits discussed included, but were not limited to decreased muscle tightness, increased joint range of motion, and decreased pain.  Risk discussed included, but were not limited pain and discomfort, bleeding, bruising, excessive weakness, venous thrombosis, muscle atrophy and dysphagia.  Anticipated outcomes of the procedure as well as he risks and benefits of the alternatives to the procedure, and the roles and tasks of the personnel to be involved, were discussed with the patient, and the patient consents to the procedure and agrees to proceed. A copy of the patient medication guide was given to the patient which explains the blackbox warning. ° °Patients identity and treatment sites confirmed Yes.  . ° °Details of Procedure: °Skin was cleaned with alcohol. Prior to injection, the needle plunger was aspirated to make sure the needle was not within a blood vessel.  There was no blood retrieved on aspiration.   ° °Following is a summary of the muscles injected  And the amount of Botulinum toxin used: ° °Dilution °200 units of Botox was reconstituted with 4 ml of preservative free normal saline. °Time of reconstitution: At the time of the office visit (<30 minutes prior to injection)  ° °Injections  °155 total units of Botox was injected with a 30 gauge needle. ° °Injection Sites: °L occipitalis: 15 units- 3 sites  °R occiptalis: 15 units- 3 sites ° °L upper trapezius: 15 units- 3 sites °R upper trapezius: 15 units- 3 sits          °L paraspinal: 10 units- 2 sites °R paraspinal: 10 units- 2 sites ° °Face °L frontalis(2 injection sites):10 units   °R frontalis(2 injection sites):10 units         °L corrugator: 5 units   °R corrugator: 5 units           °Procerus: 5 units   °L temporalis: 20 units °R temporalis: 20 units  ° °Agent:  °200 units of botulinum Type  A (Onobotulinum Toxin type A) was reconstituted with 4 ml of preservative free normal saline.  °Time of reconstitution: At the time of the office visit (<30 minutes prior to injection)  ° ° ° Total injected (Units):  155 ° Total wasted (Units):  45 ° °Patient tolerated procedure well without complications.   °Reinjection is anticipated in 3 months. ° ° °

## 2021-08-13 ENCOUNTER — Encounter: Payer: Self-pay | Admitting: Neurology

## 2021-08-13 NOTE — Progress Notes (Signed)
DMV Tinted Waiver ?

## 2021-08-13 NOTE — Progress Notes (Signed)
FMLA forms faxed 08/06/21 and 08/13/21.  ?

## 2021-09-18 ENCOUNTER — Encounter: Payer: Self-pay | Admitting: Neurology

## 2021-10-25 ENCOUNTER — Ambulatory Visit: Payer: Managed Care, Other (non HMO) | Admitting: Neurology

## 2021-10-25 DIAGNOSIS — G43709 Chronic migraine without aura, not intractable, without status migrainosus: Secondary | ICD-10-CM

## 2021-10-25 MED ORDER — ONABOTULINUMTOXINA 200 UNITS IJ SOLR
200.0000 [IU] | Freq: Once | INTRAMUSCULAR | Status: AC
  Administered 2021-10-25: 155 [IU] via INTRAMUSCULAR

## 2021-10-25 NOTE — Progress Notes (Unsigned)
[facility]   Procedure Note Botox  Attending: Dr. Xiong Haidar  Preoperative Diagnosis(es): Chronic migraine  Consent obtained from: The patient Benefits discussed included, but were not limited to decreased muscle tightness, increased joint range of motion, and decreased pain.  Risk discussed included, but were not limited pain and discomfort, bleeding, bruising, excessive weakness, venous thrombosis, muscle atrophy and dysphagia.  Anticipated outcomes of the procedure as well as he risks and benefits of the alternatives to the procedure, and the roles and tasks of the personnel to be involved, were discussed with the patient, and the patient consents to the procedure and agrees to proceed. A copy of the patient medication guide was given to the patient which explains the blackbox warning.  Patients identity and treatment sites confirmed Yes.  .  Details of Procedure: Skin was cleaned with alcohol. Prior to injection, the needle plunger was aspirated to make sure the needle was not within a blood vessel.  There was no blood retrieved on aspiration.    Following is a summary of the muscles injected  And the amount of Botulinum toxin used:  Dilution 200 units of Botox was reconstituted with 4 ml of preservative free normal saline. Time of reconstitution: At the time of the office visit (<30 minutes prior to injection)   Injections  155 total units of Botox was injected with a 30 gauge needle.  Injection Sites: L occipitalis: 15 units- 3 sites  R occiptalis: 15 units- 3 sites  L upper trapezius: 15 units- 3 sites R upper trapezius: 15 units- 3 sits          L paraspinal: 10 units- 2 sites R paraspinal: 10 units- 2 sites  Face L frontalis(2 injection sites):10 units   R frontalis(2 injection sites):10 units         L corrugator: 5 units   R corrugator: 5 units           Procerus: 5 units   L temporalis: 20 units R temporalis: 20 units   Agent:  200 units of botulinum Type  A (Onobotulinum Toxin type A) was reconstituted with 4 ml of preservative free normal saline.  Time of reconstitution: At the time of the office visit (<30 minutes prior to injection)     Total injected (Units): 155  Total wasted (Units): ***  Patient tolerated procedure well without complications.   Reinjection is anticipated in 3 months.     

## 2021-11-20 ENCOUNTER — Other Ambulatory Visit: Payer: Self-pay

## 2021-11-20 ENCOUNTER — Encounter: Payer: Self-pay | Admitting: Neurology

## 2021-11-20 DIAGNOSIS — G43709 Chronic migraine without aura, not intractable, without status migrainosus: Secondary | ICD-10-CM

## 2021-11-20 MED ORDER — BOTOX 200 UNITS IJ SOLR: INTRAMUSCULAR | 4 refills | Status: DC

## 2021-12-07 IMAGING — CT CT RENAL STONE PROTOCOL
2 of 4 series · 16 of 46 positions shown, 18 images · non-contrast
Comparison: None.

CLINICAL DATA: Left flank pain.

EXAM:
CT ABDOMEN AND PELVIS WITHOUT CONTRAST
TECHNIQUE: Multidetector CT imaging of the abdomen and pelvis was performed
following the standard protocol without IV contrast.

[Series 2: axial st · axial · 0.97mm/px · z∈[+637,+1112]mm · 13 of 107 slices shown, 15 images]
[im 6/107  soft-tissue]
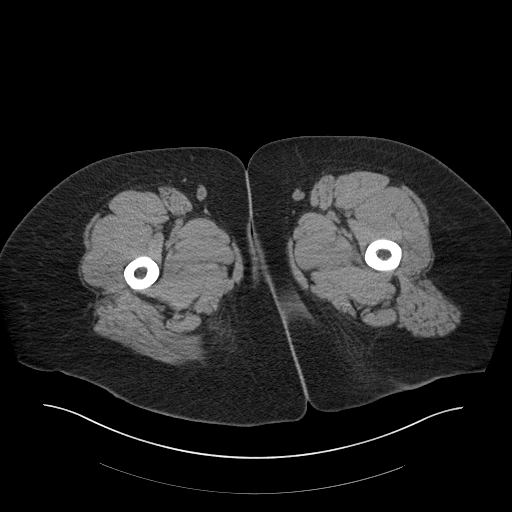
[im 6/107  bone]
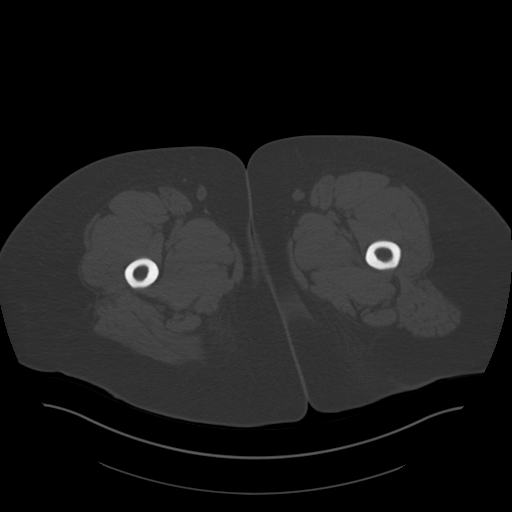
[im 12/107  soft-tissue]
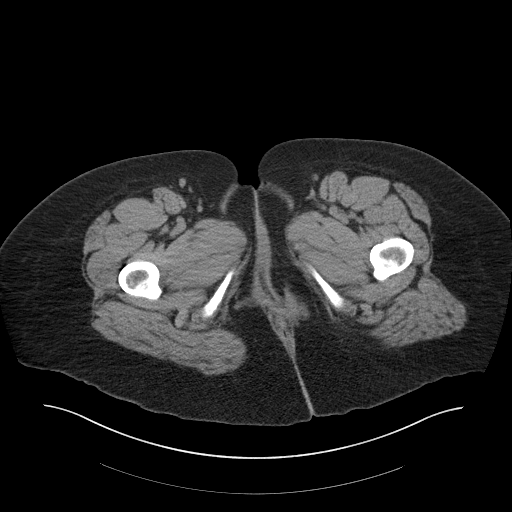
[im 24/107  soft-tissue]
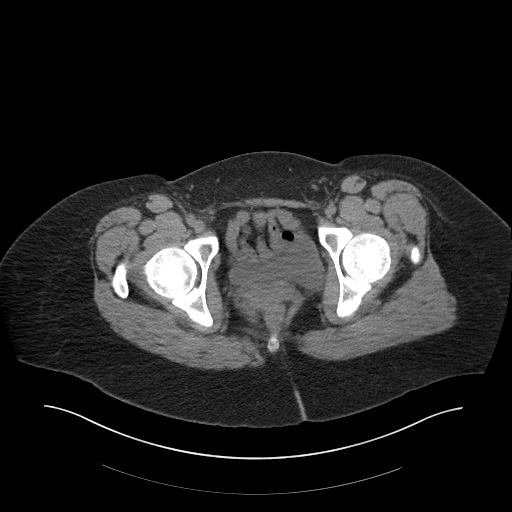
[im 30/107  soft-tissue]
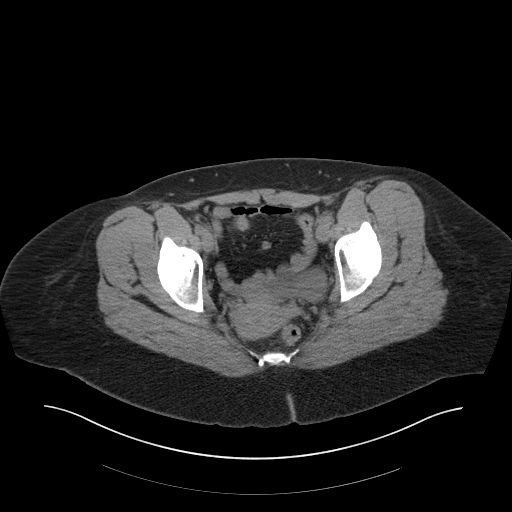
[im 36/107  soft-tissue]
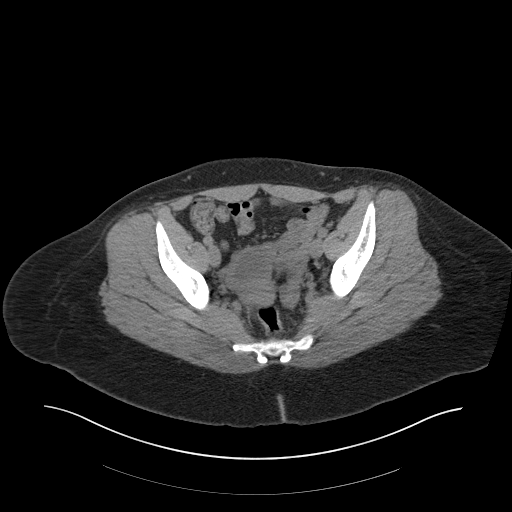
[im 48/107  soft-tissue]
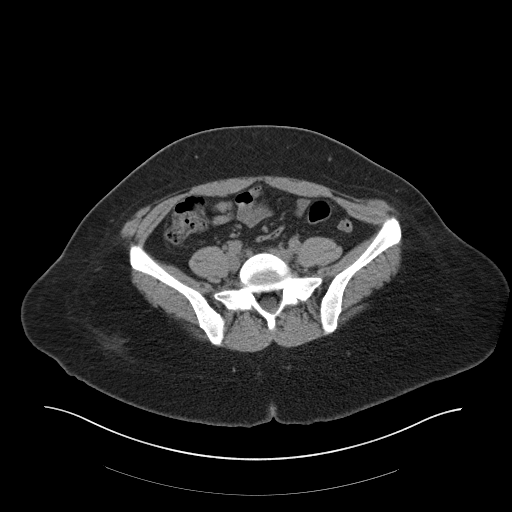
[im 54/107  soft-tissue]
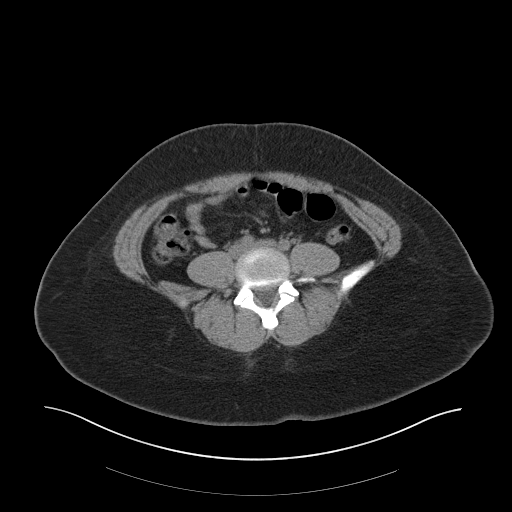
[im 59/107  soft-tissue]
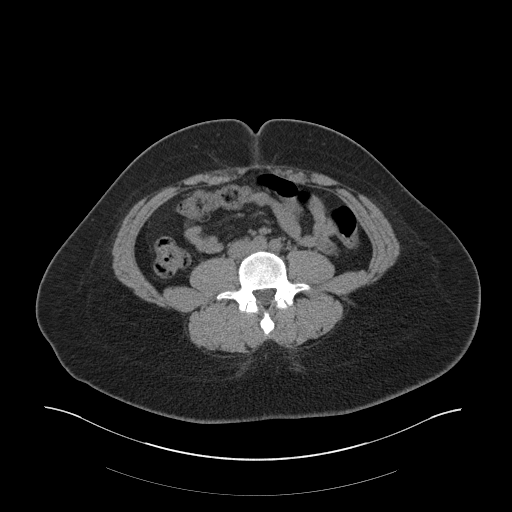
[im 71/107  soft-tissue]
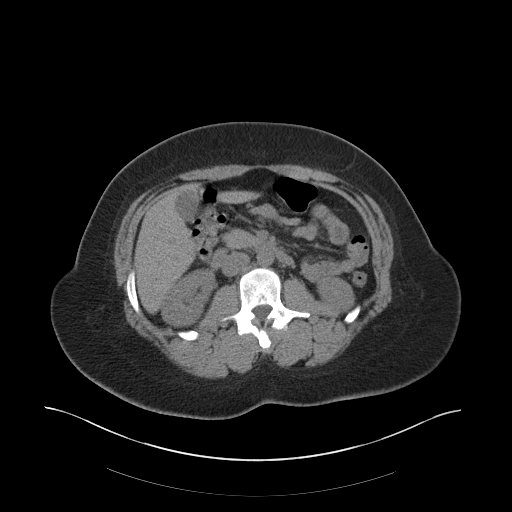
[im 71/107  bone]
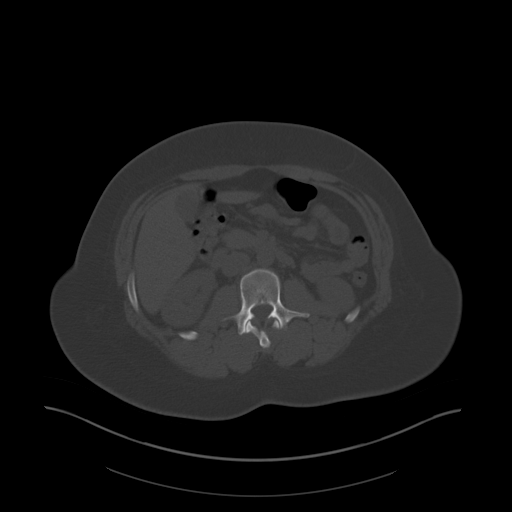
[im 77/107  soft-tissue]
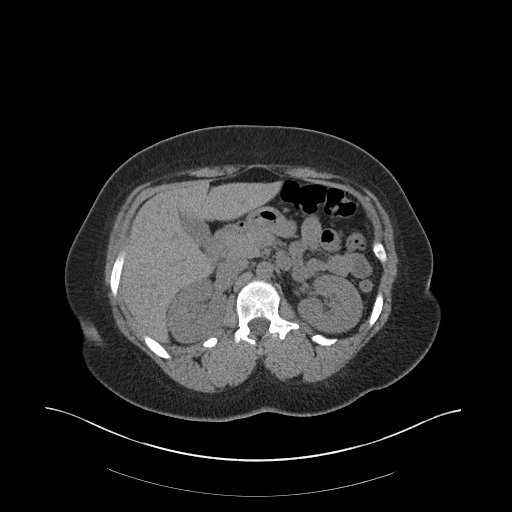
[im 83/107  soft-tissue]
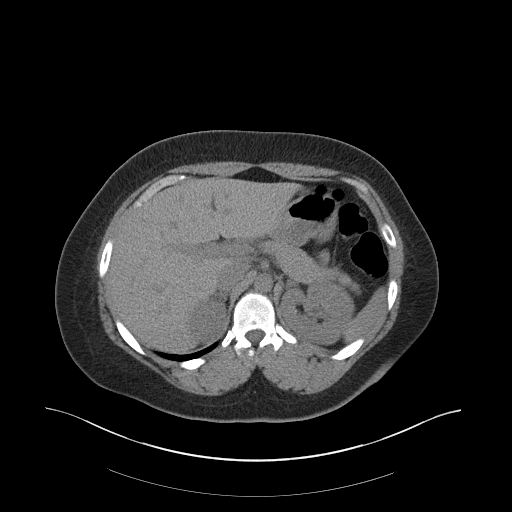
[im 95/107  soft-tissue]
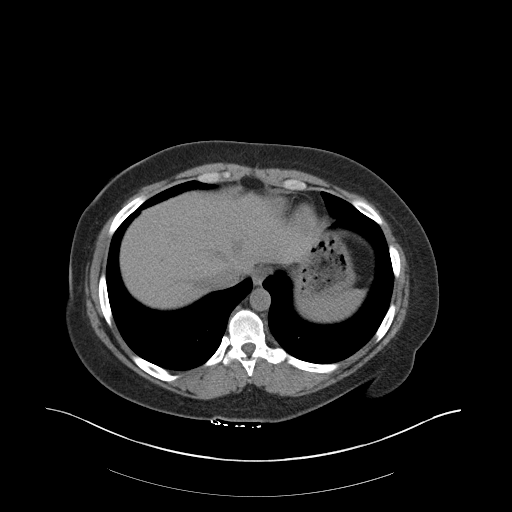
[im 101/107  soft-tissue]
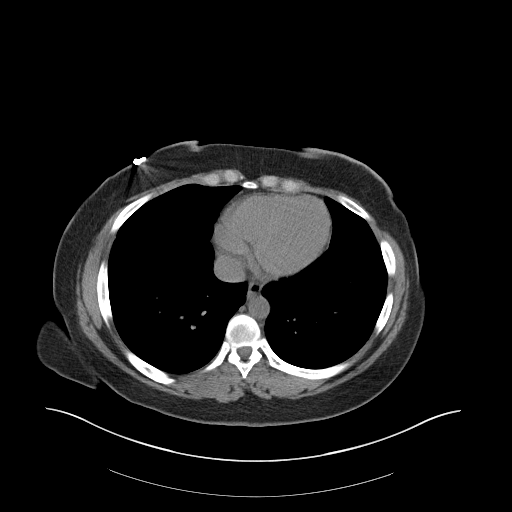

[Series 4: coronal · coronal · 0.85mm/px · 3 of 151 slices shown]
[im 51/151  soft-tissue]
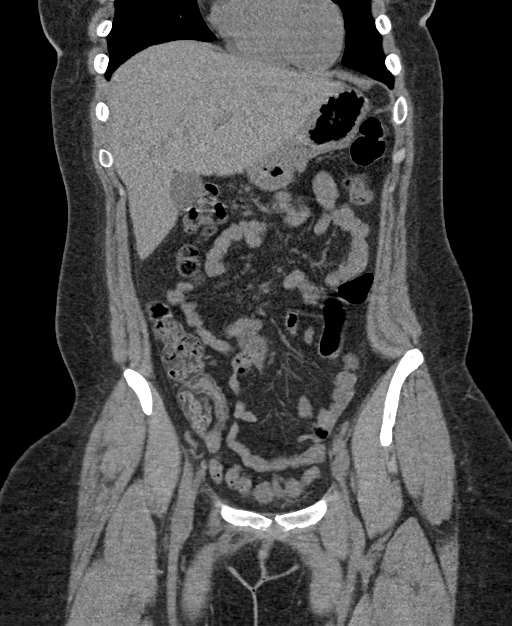
[im 67/151  soft-tissue]
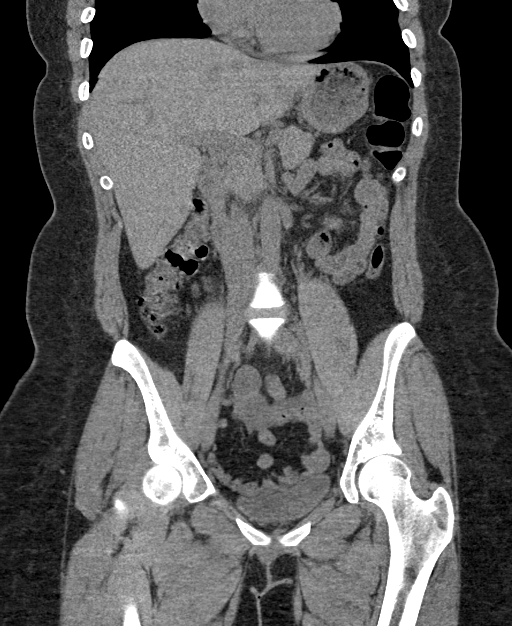
[im 84/151  soft-tissue]
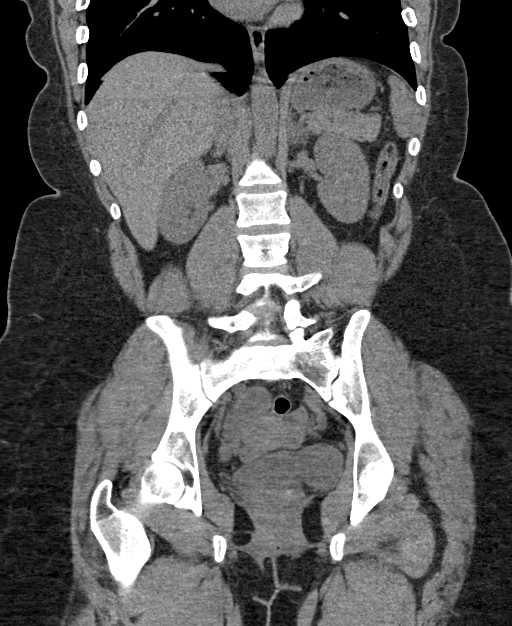

[16 of 46 positions shown; findings below may reference images not displayed]

FINDINGS: Lower chest: Insert lung bases

Hepatobiliary: No hepatic lesions are identified without contrast.
No intra or extrahepatic biliary dilatation. The gallbladder appears
normal.

Pancreas: No mass, inflammation or ductal dilatation.

Spleen: Normal size. No focal lesions.

Adrenals/Urinary Tract: The adrenal glands are normal.

No renal, ureteral or bladder calculi or mass is identified.

Stomach/Bowel: The stomach, duodenum, small bowel and colon are
grossly normal without oral contrast. No inflammatory changes, mass
lesions or obstructive findings. The appendix is normal.

Vascular/Lymphatic: The aorta is normal in caliber. No
atheroscerlotic calcifications. No mesenteric of retroperitoneal
mass or adenopathy. Small scattered lymph nodes are noted.

Reproductive: The uterus is unremarkable. There are bilateral
ovarian cysts noted.

Other: No pelvic mass or adenopathy. No free pelvic fluid
collections. No inguinal mass or adenopathy. No abdominal wall
hernia or subcutaneous lesions.

Musculoskeletal: No significant bony findings.
IMPRESSION: 1. No renal, ureteral or bladder calculi or mass.
2. No acute abdominal/pelvic findings, mass lesions or adenopathy.
3. Bilateral ovarian cysts.

## 2022-01-06 ENCOUNTER — Telehealth: Payer: Self-pay

## 2022-01-06 NOTE — Telephone Encounter (Signed)
While creating spreadsheet for Botox schedule, noticed that this pt's PA renewal was never initiated. Per previous records the most recent PA expired 11/28/21, pt has appointment on 01/24/22  Submitted an URGENT Prior Authorization request to [facility] for  BOTOX 200  via CoverMyMeds. Will update once we receive a response.   Key: [id number]

## 2022-01-08 ENCOUNTER — Other Ambulatory Visit (HOSPITAL_COMMUNITY): Payer: Self-pay

## 2022-01-08 NOTE — Telephone Encounter (Signed)
Received notification from [facility] regarding a prior authorization for  BOTOX 200 . Authorization has been APPROVED from 01/06/2022 to 01/06/2023. Approval letter sent to scan center.  Patient must fill through  [facility] [phone] or [facility] Employee and [place] [phone]  Authorization # [id number]

## 2022-01-14 ENCOUNTER — Telehealth: Payer: Self-pay

## 2022-01-14 NOTE — Telephone Encounter (Signed)
PA needed

## 2022-01-14 NOTE — Telephone Encounter (Signed)
If PA for Botox, please see previous encounter from 01-06-2022

## 2022-01-24 ENCOUNTER — Ambulatory Visit (INDEPENDENT_AMBULATORY_CARE_PROVIDER_SITE_OTHER): Payer: Managed Care, Other (non HMO) | Admitting: Neurology

## 2022-01-24 DIAGNOSIS — G43009 Migraine without aura, not intractable, without status migrainosus: Secondary | ICD-10-CM | POA: Diagnosis not present

## 2022-01-24 MED ORDER — ONABOTULINUMTOXINA 100 UNITS IJ SOLR
200.0000 [IU] | Freq: Once | INTRAMUSCULAR | Status: AC
  Administered 2022-01-24: 155 [IU] via INTRAMUSCULAR

## 2022-01-24 NOTE — Progress Notes (Signed)
[facility]  ° °Procedure Note Botox ° °Attending: Dr. Keyen Marban ° °Preoperative Diagnosis(es): Chronic migraine ° °Consent obtained from: The patient °Benefits discussed included, but were not limited to decreased muscle tightness, increased joint range of motion, and decreased pain.  Risk discussed included, but were not limited pain and discomfort, bleeding, bruising, excessive weakness, venous thrombosis, muscle atrophy and dysphagia.  Anticipated outcomes of the procedure as well as he risks and benefits of the alternatives to the procedure, and the roles and tasks of the personnel to be involved, were discussed with the patient, and the patient consents to the procedure and agrees to proceed. A copy of the patient medication guide was given to the patient which explains the blackbox warning. ° °Patients identity and treatment sites confirmed Yes.  . ° °Details of Procedure: °Skin was cleaned with alcohol. Prior to injection, the needle plunger was aspirated to make sure the needle was not within a blood vessel.  There was no blood retrieved on aspiration.   ° °Following is a summary of the muscles injected  And the amount of Botulinum toxin used: ° °Dilution °200 units of Botox was reconstituted with 4 ml of preservative free normal saline. °Time of reconstitution: At the time of the office visit (<30 minutes prior to injection)  ° °Injections  °155 total units of Botox was injected with a 30 gauge needle. ° °Injection Sites: °L occipitalis: 15 units- 3 sites  °R occiptalis: 15 units- 3 sites ° °L upper trapezius: 15 units- 3 sites °R upper trapezius: 15 units- 3 sits          °L paraspinal: 10 units- 2 sites °R paraspinal: 10 units- 2 sites ° °Face °L frontalis(2 injection sites):10 units   °R frontalis(2 injection sites):10 units         °L corrugator: 5 units   °R corrugator: 5 units           °Procerus: 5 units   °L temporalis: 20 units °R temporalis: 20 units  ° °Agent:  °200 units of botulinum Type  A (Onobotulinum Toxin type A) was reconstituted with 4 ml of preservative free normal saline.  °Time of reconstitution: At the time of the office visit (<30 minutes prior to injection)  ° ° ° Total injected (Units):  155 ° Total wasted (Units):  45 ° °Patient tolerated procedure well without complications.   °Reinjection is anticipated in 3 months. ° ° °

## 2022-01-31 ENCOUNTER — Other Ambulatory Visit: Payer: Self-pay | Admitting: Neurology

## 2022-01-31 ENCOUNTER — Encounter: Payer: Self-pay | Admitting: Neurology

## 2022-01-31 ENCOUNTER — Other Ambulatory Visit: Payer: Self-pay

## 2022-01-31 MED ORDER — ZOLMITRIPTAN 5 MG NA SOLN: NASAL | 5 refills | Status: DC

## 2022-02-04 ENCOUNTER — Telehealth: Payer: Self-pay

## 2022-02-04 ENCOUNTER — Other Ambulatory Visit (HOSPITAL_COMMUNITY): Payer: Self-pay

## 2022-02-04 NOTE — Telephone Encounter (Signed)
Patient Advocate Encounter   Received notification from [employer] that prior authorization is required for ZOLMitriptan 5MG  solution  Submitted: 02-04-2022 Key [id number]  Status is pending

## 2022-02-06 NOTE — Telephone Encounter (Signed)
Received a fax from [employer] regarding Prior Authorization for ZOLMitriptan 5MG  solution .   Authorization has been DENIED due to requires that you meet ONE of the following:  1) You have previously tried at least three clinically appropriate covered alternatives with a different active ingredient(s) but the same route of administration (by nose) that are used for treatment of migraines and at least one medication must be from the same drug class as zolmitriptan 5mg  nose spray.  2) Your doctor has provided documentation that you have experienced a therapeutic failure (the medication did not work for you), contraindication to (medical reason why you cannot use), or intolerance of (you experienced side effects) at least three clinically appropriate covered agents with a different active ingredient(s) but the same route of administration as zolmitriptan 5mg  nose spray. The covered alternative in the same drug class as zolmitriptan 5mg  nose spray with a different active ingredient but the same route of administration may include sumatriptan nose spray. Additional covered alternatives may include, but are not limited to, butorphanol nose spray and dihydroergotamine nose spray. Please note, dihydroergotamine nose spray may require an additional prior authorization.

## 2022-02-07 ENCOUNTER — Other Ambulatory Visit: Payer: Self-pay | Admitting: Neurology

## 2022-02-07 MED ORDER — SUMATRIPTAN 20 MG/ACT NA SOLN: 20.0000 mg | NASAL | 5 refills | Status: DC | PRN

## 2022-02-07 NOTE — Telephone Encounter (Signed)
Patient advised,  Now her insurance has decided to not cover the Zomig nasal spray unless she fails other medications first.  I have sent a prescription for [facility]riptan nasal spray to [facility] [place] (which is were the Zomig prescription was sent).  If she would like it sent to a different pharmacy, please resend to her favored pharmacy and cancel the original order

## 2022-02-13 NOTE — Telephone Encounter (Signed)
This has been resolved. Please sign off. Thank you.

## 2022-03-06 ENCOUNTER — Other Ambulatory Visit: Payer: Self-pay | Admitting: Neurology

## 2022-03-06 MED ORDER — ONDANSETRON 4 MG PO TBDP: 4.0000 mg | ORAL_TABLET | Freq: Three times a day (TID) | ORAL | 5 refills | Status: DC | PRN

## 2022-03-24 ENCOUNTER — Ambulatory Visit: Payer: Managed Care, Other (non HMO) | Admitting: Neurology

## 2022-04-24 ENCOUNTER — Encounter: Payer: Self-pay | Admitting: Neurology

## 2022-04-25 ENCOUNTER — Ambulatory Visit: Payer: Managed Care, Other (non HMO) | Admitting: Neurology

## 2022-04-25 DIAGNOSIS — G43709 Chronic migraine without aura, not intractable, without status migrainosus: Secondary | ICD-10-CM | POA: Diagnosis not present

## 2022-04-25 MED ORDER — ONABOTULINUMTOXINA 100 UNITS IJ SOLR
200.0000 [IU] | Freq: Once | INTRAMUSCULAR | Status: AC
  Administered 2022-04-25: 155 [IU] via INTRAMUSCULAR

## 2022-04-25 NOTE — Progress Notes (Signed)
[facility] Clinic  ° °Procedure Note Botox ° °Attending: Dr. Shanta Hartner ° °Preoperative Diagnosis(es): Chronic migraine ° °Consent obtained from: The patient °Benefits discussed included, but were not limited to decreased muscle tightness, increased joint range of motion, and decreased pain.  Risk discussed included, but were not limited pain and discomfort, bleeding, bruising, excessive weakness, venous thrombosis, muscle atrophy and dysphagia.  Anticipated outcomes of the procedure as well as he risks and benefits of the alternatives to the procedure, and the roles and tasks of the personnel to be involved, were discussed with the patient, and the patient consents to the procedure and agrees to proceed. A copy of the patient medication guide was given to the patient which explains the blackbox warning. ° °Patients identity and treatment sites confirmed Yes.  . ° °Details of Procedure: °Skin was cleaned with alcohol. Prior to injection, the needle plunger was aspirated to make sure the needle was not within a blood vessel.  There was no blood retrieved on aspiration.   ° °Following is a summary of the muscles injected  And the amount of Botulinum toxin used: ° °Dilution °200 units of Botox was reconstituted with 4 ml of preservative free normal saline. °Time of reconstitution: At the time of the office visit (<30 minutes prior to injection)  ° °Injections  °155 total units of Botox was injected with a 30 gauge needle. ° °Injection Sites: °L occipitalis: 15 units- 3 sites  °R occiptalis: 15 units- 3 sites ° °L upper trapezius: 15 units- 3 sites °R upper trapezius: 15 units- 3 sits          °L paraspinal: 10 units- 2 sites °R paraspinal: 10 units- 2 sites ° °Face °L frontalis(2 injection sites):10 units   °R frontalis(2 injection sites):10 units         °L corrugator: 5 units   °R corrugator: 5 units           °Procerus: 5 units   °L temporalis: 20 units °R temporalis: 20 units  ° °Agent:  °200 units of botulinum Type  A (Onobotulinum Toxin type A) was reconstituted with 4 ml of preservative free normal saline.  °Time of reconstitution: At the time of the office visit (<30 minutes prior to injection)  ° ° ° Total injected (Units):  155 ° Total wasted (Units):  45 ° °Patient tolerated procedure well without complications.   °Reinjection is anticipated in 3 months. ° ° °

## 2022-05-14 ENCOUNTER — Telehealth: Payer: Self-pay

## 2022-05-14 MED ORDER — PREDNISONE 10 MG (21) PO TBPK: ORAL_TABLET | ORAL | 0 refills | Status: DC

## 2022-05-14 NOTE — Telephone Encounter (Signed)
Patient states that she has had a horrible migraine since last Thursday, went to ED and was cleared to go but today her eyes are swollen and she is having sharp pain behind her eyes along with some blurred vision. Patient is asking what she can do, has taken medication as prescribed.

## 2022-05-14 NOTE — Telephone Encounter (Signed)
Patient advised Per dr,jaffe,  We can prescribe her a prednisone taper  [facility]

## 2022-05-16 ENCOUNTER — Encounter: Payer: Self-pay | Admitting: Neurology

## 2022-06-11 ENCOUNTER — Telehealth: Payer: Self-pay

## 2022-06-11 NOTE — Telephone Encounter (Signed)
Patient LVM returning a call to [facility].

## 2022-06-12 NOTE — Telephone Encounter (Signed)
Paperwork filled out and will be faxed over to the patient job.  Intermitted leave goo for one year.

## 2022-07-25 ENCOUNTER — Ambulatory Visit: Payer: Managed Care, Other (non HMO) | Admitting: Neurology

## 2022-07-25 DIAGNOSIS — G43709 Chronic migraine without aura, not intractable, without status migrainosus: Secondary | ICD-10-CM

## 2022-07-25 MED ORDER — ONABOTULINUMTOXINA 100 UNITS IJ SOLR
200.0000 [IU] | Freq: Once | INTRAMUSCULAR | Status: AC
  Administered 2022-07-25: 155 [IU] via INTRAMUSCULAR

## 2022-07-25 NOTE — Progress Notes (Signed)
[facility]  ° °Procedure Note Botox ° °Attending: Dr. Carra Brindley ° °Preoperative Diagnosis(es): Chronic migraine ° °Consent obtained from: The patient °Benefits discussed included, but were not limited to decreased muscle tightness, increased joint range of motion, and decreased pain.  Risk discussed included, but were not limited pain and discomfort, bleeding, bruising, excessive weakness, venous thrombosis, muscle atrophy and dysphagia.  Anticipated outcomes of the procedure as well as he risks and benefits of the alternatives to the procedure, and the roles and tasks of the personnel to be involved, were discussed with the patient, and the patient consents to the procedure and agrees to proceed. A copy of the patient medication guide was given to the patient which explains the blackbox warning. ° °Patients identity and treatment sites confirmed Yes.  . ° °Details of Procedure: °Skin was cleaned with alcohol. Prior to injection, the needle plunger was aspirated to make sure the needle was not within a blood vessel.  There was no blood retrieved on aspiration.   ° °Following is a summary of the muscles injected  And the amount of Botulinum toxin used: ° °Dilution °200 units of Botox was reconstituted with 4 ml of preservative free normal saline. °Time of reconstitution: At the time of the office visit (<30 minutes prior to injection)  ° °Injections  °155 total units of Botox was injected with a 30 gauge needle. ° °Injection Sites: °L occipitalis: 15 units- 3 sites  °R occiptalis: 15 units- 3 sites ° °L upper trapezius: 15 units- 3 sites °R upper trapezius: 15 units- 3 sits          °L paraspinal: 10 units- 2 sites °R paraspinal: 10 units- 2 sites ° °Face °L frontalis(2 injection sites):10 units   °R frontalis(2 injection sites):10 units         °L corrugator: 5 units   °R corrugator: 5 units           °Procerus: 5 units   °L temporalis: 20 units °R temporalis: 20 units  ° °Agent:  °200 units of botulinum Type  A (Onobotulinum Toxin type A) was reconstituted with 4 ml of preservative free normal saline.  °Time of reconstitution: At the time of the office visit (<30 minutes prior to injection)  ° ° ° Total injected (Units):  155 ° Total wasted (Units):  45 ° °Patient tolerated procedure well without complications.   °Reinjection is anticipated in 3 months. ° ° °

## 2022-08-05 NOTE — Progress Notes (Unsigned)
NEUROLOGY FOLLOW UP OFFICE NOTE  Kayla Owens [id number]  Assessment/Plan:   Migraine without aura, without status migrainosus, not intractable   Migraine prevention:  Botox *** Migraine rescue:  Zomig 5mg  NS, Zofran 4mg  *** Limit use of pain relievers to no more than 2 days out of week to prevent risk of rebound or medication-overuse headache. Keep headache diary Follow up one year.     Subjective:  Kayla Owens is a 32 year old woman with asthma and depression and history of viral meningitis (06/2018) who follows up for migraines.   UPDATE: Intensity:  10/10 Duration:  15 minutes with treatment Frequency:  3 times a month   Rescue protocol:  Zomig 5mg  NS, 1/2 Tylenol, ibuprofen 400mg  Current NSAIDS: ibuprofen Current analgesics: Tylenol, Excedrin Current triptans: Zomig 5 mg NS (it worked within 15 minutes) Current ergotamine: None Current anti-emetic: None Current muscle relaxants: None Current anti-anxiolytic: None Current sleep aide: None Current Antihypertensive medications: None Current Antidepressant medications: Lexapro 10mg  Current Anticonvulsant medications: None Current anti-CGRP: None Current Vitamins/Herbal/Supplements: Multivitamin, fish oil Current Antihistamines/Decongestants: None Other therapy: Botox Hormone/birth control: None       Caffeine: No Diet: Hydrates, no soda Exercise: Yes Depression: Yes but controlled; Anxiety: Yes but controlled Other pain: No Sleep hygiene: Varies   HISTORY: Onset:  32 years old Location:  Frontal/bi-temporal,sometimes occipital as well Quality:  Pounding/pressure Initial intensity:  10/10 Aura:  Sometimes preceded by blindspots in her vision Prodrome:  no Postdrome:  no Associated symptoms: Nausea vomiting, photophobia, phonophobia, osmophobia.  She has not had any new worse headache of her life, waking up from sleep Initial duration:  At least 2 days Initial Frequency:  16 headache days per  month for several years Triggers:  None Relieving factors:  Rubbing temples, laying in dark quiet cool room Activity:  Cannot function 4 days per month.   Past NSAIDS:  Aleve, diclofenac, Mobic, toradol Past analgesics:  acetaminophen, Goody, BC, Excedrin Past abortive triptans:  Sumatriptan tablet, Maxalt Past muscle relaxants:  tizanidine, cyclobenzaprine Past anti-emetic:  promethazine Past antihypertensive medications:  no Past antidepressant medications:  Amitriptyline, nortriptyline, citalopram Past anticonvulsant medications:  topiramate Past vitamins/Herbal/Supplements:  no Other past therapies:  no   Family history of headache/migraine:  mother, maternal grandmother   Reportedly had MRI of brain in 2009 which was negative.    PAST MEDICAL HISTORY: Past Medical History:  Diagnosis Date   Anxiety    Asthma    Bilateral ovarian cysts    Depression    Intractable chronic migraine without aura    neurologist-- dr Shon Millet (receives botox treatment)    MEDICATIONS: Current Outpatient Medications on File Prior to Visit  Medication Sig Dispense Refill   albuterol (PROVENTIL HFA;VENTOLIN HFA) 108 (90 Base) MCG/ACT inhaler Inhale 1-2 puffs into the lungs every 6 (six) hours as needed for wheezing or shortness of breath. 1 Inhaler 3   beclomethasone (QVAR) 40 MCG/ACT inhaler Inhale 2 puffs into the lungs daily. (Patient taking differently: Inhale 2 puffs into the lungs 2 (two) times daily.) 1 Inhaler 3   botulinum toxin Type A (BOTOX) 200 units injection Inject 155 units IM into the multiple sites of head,neck and face every 90 days 1 each 4   DULoxetine (CYMBALTA) 30 MG capsule Take 30 mg by mouth daily.     ergocalciferol (VITAMIN D2) 1.25 MG (50000 UT) capsule Take by mouth.     montelukast (SINGULAIR) 10 MG tablet Take 1 tablet (10 mg total) by mouth at  bedtime. 90 tablet 1   OnabotulinumtoxinA (BOTOX IJ) Inject 1 each as directed every 3 (three) months.     ondansetron  (ZOFRAN-ODT) 4 MG disintegrating tablet Take 1 tablet (4 mg total) by mouth every 8 (eight) hours as needed for nausea or vomiting. 20 tablet 5   predniSONE (STERAPRED UNI-PAK 21 TAB) 10 MG (21) TBPK tablet take 60mg  day 1, then 50mg  day 2, then 40mg  day 3, then 30mg  day 4, then 20mg  day 5, then 10mg  day 6, then STOP 21 tablet 0   SUMAtriptan (IMITREX) 20 MG/ACT nasal spray Place 1 spray (20 mg total) into the nose as needed for migraine or headache. May repeat in 2 hours if headache persists or recurs.  Maximum 2 sprays in 24 hours. 6 each 5   zolmitriptan (ZOMIG) 5 MG nasal solution 1 SPRAY IN ONE NOSTRIL AT EARLIEST ONSET OF MIGRAINE. MAY REPEAT DOSE X1 IN 2HOURS 6 each 5   No current facility-administered medications on file prior to visit.    ALLERGIES: No Known Allergies  FAMILY HISTORY: Family History  Problem Relation Age of Onset   Arthritis Mother    Hypertension Mother    Lupus Mother    Heart disease Maternal Grandmother    Heart disease Maternal Grandfather    Hypertension Maternal Grandfather    Arthritis Paternal Grandmother    Stomach cancer Paternal Grandfather    Ovarian cancer Maternal Aunt    Ovarian cancer Maternal Aunt       Objective:  *** General: No acute distress.  Patient appears ***-groomed.   Head:  Normocephalic/atraumatic Eyes:  Fundi examined but not visualized Neck: supple, no paraspinal tenderness, full range of motion Heart:  Regular rate and rhythm Lungs:  Clear to auscultation bilaterally Back: No paraspinal tenderness Neurological Exam: alert and oriented to person, place, and time.  Speech fluent and not dysarthric, language intact.  CN II-XII intact. Bulk and tone normal, muscle strength 5/5 throughout.  Sensation to light touch intact.  Deep tendon reflexes 2+ throughout, toes downgoing.  Finger to nose testing intact.  Gait normal, Romberg negative.   Shon Millet, DO  CC: ***

## 2022-08-07 ENCOUNTER — Ambulatory Visit: Payer: Managed Care, Other (non HMO) | Admitting: Neurology

## 2022-08-07 ENCOUNTER — Encounter: Payer: Self-pay | Admitting: Neurology

## 2022-08-07 VITALS — BP 130/67 | HR 98 | Ht 68.0 in | Wt 219.0 lb

## 2022-08-07 DIAGNOSIS — G43009 Migraine without aura, not intractable, without status migrainosus: Secondary | ICD-10-CM | POA: Diagnosis not present

## 2022-08-07 MED ORDER — SUMATRIPTAN 20 MG/ACT NA SOLN: 20.0000 mg | NASAL | 5 refills | Status: DC | PRN

## 2022-08-12 ENCOUNTER — Encounter: Payer: Self-pay | Admitting: Neurology

## 2022-08-13 DIAGNOSIS — Z0279 Encounter for issue of other medical certificate: Secondary | ICD-10-CM

## 2022-08-19 NOTE — Progress Notes (Unsigned)
FMLA paperwork Received

## 2022-10-24 ENCOUNTER — Ambulatory Visit (INDEPENDENT_AMBULATORY_CARE_PROVIDER_SITE_OTHER): Payer: Managed Care, Other (non HMO) | Admitting: Neurology

## 2022-10-24 DIAGNOSIS — G43709 Chronic migraine without aura, not intractable, without status migrainosus: Secondary | ICD-10-CM | POA: Diagnosis not present

## 2022-10-24 MED ORDER — ONABOTULINUMTOXINA 100 UNITS IJ SOLR
200.0000 [IU] | Freq: Once | INTRAMUSCULAR | Status: AC
Start: 2022-10-24 — End: 2022-10-24
  Administered 2022-10-24: 155 [IU] via INTRAMUSCULAR

## 2022-10-24 NOTE — Progress Notes (Signed)

## 2022-11-06 ENCOUNTER — Encounter: Payer: Self-pay | Admitting: Neurology

## 2022-11-21 ENCOUNTER — Telehealth: Payer: Self-pay

## 2022-11-21 NOTE — Telephone Encounter (Signed)
Per Patient mychart message, I need a refill of my Botox sent to [facility] pharmacy. There are no more on file and I was unable to submit for a refill. '     After review of chart a Prior Auth is due as well.    PA team please start a PA  for Botox 200 units every 90 days

## 2022-12-02 ENCOUNTER — Other Ambulatory Visit (HOSPITAL_COMMUNITY): Payer: Self-pay

## 2022-12-03 ENCOUNTER — Other Ambulatory Visit (HOSPITAL_COMMUNITY): Payer: Self-pay

## 2022-12-03 NOTE — Telephone Encounter (Signed)
Per insurance, patient has active authorization on file effective until 01-07-2023. Will not let me submit an early PA.

## 2022-12-19 ENCOUNTER — Telehealth: Payer: Self-pay | Admitting: Pharmacy Technician

## 2022-12-19 ENCOUNTER — Encounter: Payer: Self-pay | Admitting: Pharmacy Technician

## 2022-12-19 ENCOUNTER — Other Ambulatory Visit (HOSPITAL_COMMUNITY): Payer: Self-pay

## 2022-12-19 NOTE — Progress Notes (Deleted)
Pharmacy Patient Advocate Encounter   Received notification from CoverMyMeds that prior authorization for BOTOX 200U is required/requested.   Insurance verification completed.   The patient is insured through [facility] .   Per test claim: PA required; PA submitted to [facility] via CoverMyMeds Key/confirmation #/EOC [id number] Status is pending

## 2022-12-19 NOTE — Telephone Encounter (Signed)
PA has been submitted.

## 2022-12-19 NOTE — Telephone Encounter (Signed)
BotoxOne verification has been submitted. Benefit Verification:  [facility]  Pharmacy PA has been submitted for BOTOX 200 via CoverMyMeds. INSURANCE: MEDIMPACT DATE SUBMITTED: 9.13.24 KEY: [id number] Status is pending

## 2022-12-25 NOTE — Telephone Encounter (Signed)
Pharmacy Patient Advocate Encounter  Received notification from [facility] that Prior Authorization for BOTOX 200 has been DENIED.  See denial reason below. No denial letter attached in CMM. Will attache denial letter to Media tab once received.   Your provider requested Botox 200-unit vials for the preventive treatment of chronic migraines (you experience more than 15 migraine days per month). When used for the preventive treatment of chronic migraines, our guideline named ONABOTULINUM TOXIN A (Botox) requires for approval that you have previously tried TWO (2) of the following preventive migraine treatments: divalproex sodium/sodium valproate, topiramate, propranolol, timolol, metoprolol, amitriptyline, venlafaxine, atenolol, or nadolol, unless you have a medical reason why you cannot (contraindication to). This request was denied because we did not receive information that you meet the requirement listed above.  PA #/Case ID/Reference #: [id number]

## 2022-12-26 MED ORDER — BOTOX 200 UNITS IJ SOLR: INTRAMUSCULAR | 4 refills | Status: DC

## 2022-12-29 ENCOUNTER — Other Ambulatory Visit (HOSPITAL_COMMUNITY): Payer: Self-pay

## 2022-12-29 NOTE — Telephone Encounter (Signed)
Pharmacy Patient Advocate Encounter- Botox BIV-Pharmacy Benefit:  PA was submitted to [facility] and has been approved through: 9.19.24 TO 3.18.25 Authorization# [id number]  Please send prescription to Specialty Pharmacy:  [facility] [phone] Estimated Copay is: $0  Patient IS eligible for Botox Copay Card, which will make patient's copay as little as zero. Copay card will be provided to pharmacy.  UNABLE TO PROVIDE TEST BILLING RESULTS WITH [facility].

## 2022-12-30 ENCOUNTER — Telehealth: Payer: Self-pay | Admitting: Neurology

## 2022-12-30 ENCOUNTER — Encounter: Payer: Self-pay | Admitting: Neurology

## 2022-12-30 MED ORDER — BOTOX 200 UNITS IJ SOLR: INTRAMUSCULAR | 4 refills | Status: DC

## 2022-12-30 NOTE — Telephone Encounter (Signed)
Patient called to return phone call about botox. Patient states that Kayla Owens is the best way to communicate with her but if you do need to call please leave a detailed message on [phone]

## 2022-12-30 NOTE — Telephone Encounter (Signed)
LMOVM for patient, [facility].  Botox sent to the new pharmacy. As directed.

## 2022-12-30 NOTE — Addendum Note (Signed)
Addended by: Leida Lauth on: 12/30/2022 09:04 AM   Modules accepted: Orders

## 2022-12-30 NOTE — Telephone Encounter (Signed)
Left a detailed message on VM.

## 2023-01-05 ENCOUNTER — Other Ambulatory Visit (HOSPITAL_COMMUNITY): Payer: Self-pay

## 2023-01-23 ENCOUNTER — Ambulatory Visit: Payer: Managed Care, Other (non HMO) | Admitting: Neurology

## 2023-01-23 DIAGNOSIS — G43109 Migraine with aura, not intractable, without status migrainosus: Secondary | ICD-10-CM | POA: Diagnosis not present

## 2023-01-23 MED ORDER — ONABOTULINUMTOXINA 100 UNITS IJ SOLR
200.0000 [IU] | Freq: Once | INTRAMUSCULAR | Status: AC
Start: 2023-01-23 — End: 2023-01-23
  Administered 2023-01-23: 155 [IU] via INTRAMUSCULAR

## 2023-01-23 NOTE — Progress Notes (Signed)
[facility]  ° °Procedure Note Botox ° °Attending: Dr. Marjan Rosman ° °Preoperative Diagnosis(es): Chronic migraine ° °Consent obtained from: The patient °Benefits discussed included, but were not limited to decreased muscle tightness, increased joint range of motion, and decreased pain.  Risk discussed included, but were not limited pain and discomfort, bleeding, bruising, excessive weakness, venous thrombosis, muscle atrophy and dysphagia.  Anticipated outcomes of the procedure as well as he risks and benefits of the alternatives to the procedure, and the roles and tasks of the personnel to be involved, were discussed with the patient, and the patient consents to the procedure and agrees to proceed. A copy of the patient medication guide was given to the patient which explains the blackbox warning. ° °Patients identity and treatment sites confirmed Yes.  . ° °Details of Procedure: °Skin was cleaned with alcohol. Prior to injection, the needle plunger was aspirated to make sure the needle was not within a blood vessel.  There was no blood retrieved on aspiration.   ° °Following is a summary of the muscles injected  And the amount of Botulinum toxin used: ° °Dilution °200 units of Botox was reconstituted with 4 ml of preservative free normal saline. °Time of reconstitution: At the time of the office visit (<30 minutes prior to injection)  ° °Injections  °155 total units of Botox was injected with a 30 gauge needle. ° °Injection Sites: °L occipitalis: 15 units- 3 sites  °R occiptalis: 15 units- 3 sites ° °L upper trapezius: 15 units- 3 sites °R upper trapezius: 15 units- 3 sits          °L paraspinal: 10 units- 2 sites °R paraspinal: 10 units- 2 sites ° °Face °L frontalis(2 injection sites):10 units   °R frontalis(2 injection sites):10 units         °L corrugator: 5 units   °R corrugator: 5 units           °Procerus: 5 units   °L temporalis: 20 units °R temporalis: 20 units  ° °Agent:  °200 units of botulinum Type  A (Onobotulinum Toxin type A) was reconstituted with 4 ml of preservative free normal saline.  °Time of reconstitution: At the time of the office visit (<30 minutes prior to injection)  ° ° ° Total injected (Units):  155 ° Total wasted (Units):  45 ° °Patient tolerated procedure well without complications.   °Reinjection is anticipated in 3 months. ° ° °

## 2023-04-24 ENCOUNTER — Ambulatory Visit: Payer: Managed Care, Other (non HMO) | Admitting: Neurology

## 2023-04-24 DIAGNOSIS — G43109 Migraine with aura, not intractable, without status migrainosus: Secondary | ICD-10-CM

## 2023-04-24 MED ORDER — ONABOTULINUMTOXINA 100 UNITS IJ SOLR
200.0000 [IU] | Freq: Once | INTRAMUSCULAR | Status: AC
  Administered 2023-04-24: 155 [IU] via INTRAMUSCULAR

## 2023-04-24 NOTE — Progress Notes (Signed)
Botulinum Clinic  ° °Procedure Note Botox ° °Attending: Dr.   ° °Preoperative Diagnosis(es): Chronic migraine ° °Consent obtained from: The patient °Benefits discussed included, but were not limited to decreased muscle tightness, increased joint range of motion, and decreased pain.  Risk discussed included, but were not limited pain and discomfort, bleeding, bruising, excessive weakness, venous thrombosis, muscle atrophy and dysphagia.  Anticipated outcomes of the procedure as well as he risks and benefits of the alternatives to the procedure, and the roles and tasks of the personnel to be involved, were discussed with the patient, and the patient consents to the procedure and agrees to proceed. A copy of the patient medication guide was given to the patient which explains the blackbox warning. ° °Patients identity and treatment sites confirmed Yes.  . ° °Details of Procedure: °Skin was cleaned with alcohol. Prior to injection, the needle plunger was aspirated to make sure the needle was not within a blood vessel.  There was no blood retrieved on aspiration.   ° °Following is a summary of the muscles injected  And the amount of Botulinum toxin used: ° °Dilution °200 units of Botox was reconstituted with 4 ml of preservative free normal saline. °Time of reconstitution: At the time of the office visit (<30 minutes prior to injection)  ° °Injections  °155 total units of Botox was injected with a 30 gauge needle. ° °Injection Sites: °L occipitalis: 15 units- 3 sites  °R occiptalis: 15 units- 3 sites ° °L upper trapezius: 15 units- 3 sites °R upper trapezius: 15 units- 3 sits          °L paraspinal: 10 units- 2 sites °R paraspinal: 10 units- 2 sites ° °Face °L frontalis(2 injection sites):10 units   °R frontalis(2 injection sites):10 units         °L corrugator: 5 units   °R corrugator: 5 units           °Procerus: 5 units   °L temporalis: 20 units °R temporalis: 20 units  ° °Agent:  °200 units of botulinum Type  A (Onobotulinum Toxin type A) was reconstituted with 4 ml of preservative free normal saline.  °Time of reconstitution: At the time of the office visit (<30 minutes prior to injection)  ° ° ° Total injected (Units):  155 ° Total wasted (Units):  45 ° °Patient tolerated procedure well without complications.   °Reinjection is anticipated in 3 months. ° ° °

## 2023-05-25 ENCOUNTER — Telehealth: Payer: Self-pay | Admitting: Pharmacy Technician

## 2023-05-25 NOTE — Telephone Encounter (Signed)
error 

## 2023-07-09 ENCOUNTER — Telehealth: Payer: Self-pay

## 2023-07-09 MED ORDER — BOTOX 200 UNITS IJ SOLR: INTRAMUSCULAR | 4 refills | Status: AC

## 2023-07-09 NOTE — Telephone Encounter (Signed)
 Per fax received PA needed for Botox 200 units.

## 2023-07-10 NOTE — Telephone Encounter (Signed)
 Pharmacy called to let us know a PA is needed for the pt's botox

## 2023-07-14 ENCOUNTER — Telehealth: Payer: Self-pay | Admitting: Pharmacy Technician

## 2023-07-14 ENCOUNTER — Other Ambulatory Visit (HOSPITAL_COMMUNITY): Payer: Self-pay

## 2023-07-14 NOTE — Telephone Encounter (Signed)
 Pharmacy Patient Advocate Encounter   Received notification from Pt Calls Messages that prior authorization for BOTOX 200 is required/requested.   Insurance verification completed.   The patient is insured through [facility] .   Per test claim: PA required; PA submitted to above mentioned insurance via CoverMyMeds Key/confirmation #/EOC [id number] Status is pending

## 2023-07-14 NOTE — Telephone Encounter (Signed)
 Pharmacy Patient Advocate Encounter   BotoxOne verification has been Submitted Benefit Verification #:   [id number]   Dx Code: G43.709 J-code: W2956 Procedure code: 21308

## 2023-07-14 NOTE — Telephone Encounter (Signed)
 PA has been submitted, and telephone encounter has been created. Please see telephone encounter dated 4.8.25. BOTOX ONE REPORT also submitted.

## 2023-07-16 ENCOUNTER — Other Ambulatory Visit (HOSPITAL_COMMUNITY): Payer: Self-pay

## 2023-07-16 NOTE — Telephone Encounter (Signed)
 Pharmacy Patient Advocate Encounter   BotoxOne verification has been Completed Benefit Verification #:  [id number]   Dx Code: G43.709 J-code: [id number] Procedure code: 01027

## 2023-07-16 NOTE — Telephone Encounter (Signed)
 Pharmacy Patient Advocate Encounter- Botox BIV-Pharmacy Benefit:  PA was submitted to [facility] and has been approved through: 4.9.25 TO 4.8.26 Authorization# [id number]  Please send prescription to Specialty Pharmacy:  [facility] Estimated Copay is: ?  Admin Code: 21308   DOES require Prior Auth.  Patient IS eligible for Botox Copay Card, which will make patient's copay as little as zero. Copay card will be provided to pharmacy.

## 2023-07-22 ENCOUNTER — Other Ambulatory Visit (HOSPITAL_COMMUNITY): Payer: Self-pay

## 2023-07-31 ENCOUNTER — Ambulatory Visit: Payer: Managed Care, Other (non HMO) | Admitting: Neurology

## 2023-07-31 ENCOUNTER — Telehealth: Payer: Self-pay | Admitting: Neurology

## 2023-07-31 DIAGNOSIS — G43709 Chronic migraine without aura, not intractable, without status migrainosus: Secondary | ICD-10-CM

## 2023-07-31 MED ORDER — ONABOTULINUMTOXINA 100 UNITS IJ SOLR
200.0000 [IU] | Freq: Once | INTRAMUSCULAR | Status: AC
  Administered 2023-07-31: 155 [IU] via INTRAMUSCULAR

## 2023-07-31 NOTE — Telephone Encounter (Signed)
 Pt dropped off her FMLA forms to be filled out. She would like them to be faxed in once completed. $25 fee has been paid. Forms are in Dr. Sherilyn Dings box

## 2023-07-31 NOTE — Progress Notes (Signed)

## 2023-08-07 ENCOUNTER — Ambulatory Visit: Payer: Managed Care, Other (non HMO) | Admitting: Neurology

## 2023-08-19 NOTE — Progress Notes (Unsigned)
 NEUROLOGY FOLLOW UP OFFICE NOTE  Kayla Owens [id number]  Assessment/Plan:   Migraine without aura, without status migrainosus, not intractable   Migraine prevention:  Botox  *** Migraine rescue:  sumatriptan  20mg  NS, Zofran  4mg   Limit use of pain relievers to no more than 2 days out of week to prevent risk of rebound or medication-overuse headache. Keep headache diary Follow up one year.  Subjective:  Kayla Owens is a 33 year old woman with asthma and depression and history of viral meningitis (06/2018) who follows up for migraines.   UPDATE: Usually good. Intensity:  10/10 Duration:  2 hours with sumatriptan  NS  Frequency:  0-3 times a month   She did have an intractable migraine in February requiring prednisone  taper.   Insurance would no longer cover Zomig  NS until she tries sumatriptan  NS.  Overall, doing well with sumatriptan  although migraines last longer (2 hours with sumatriptan , 15 minutes with Zomig )   Rescue protocol:  sumatriptan  20mg  NS, 1/2 Tylenol , ibuprofen  400mg  Current NSAIDS: ibuprofen  Current analgesics: Tylenol , Excedrin Current triptans: sumatriptan  20mg  NS  Current ergotamine: None Current anti-emetic: None Current muscle relaxants: None Current anti-anxiolytic: None Current sleep aide: None Current Antihypertensive medications: None Current Antidepressant medications: Lexapro  10mg  Current Anticonvulsant medications: None Current anti-CGRP: None Current Vitamins/Herbal/Supplements: Multivitamin, fish oil Current Antihistamines/Decongestants: None Other therapy: Botox  Hormone/birth control: None       Caffeine: No Diet: Hydrates, no soda Exercise: Yes Depression: Yes but controlled; Anxiety: Yes but controlled Other pain: No Sleep hygiene: Varies   HISTORY: Onset:  33 years old Location:  Frontal/bi-temporal,sometimes occipital as well Quality:  Pounding/pressure Initial intensity:  10/10 Aura:  Sometimes preceded by  blindspots in her vision Prodrome:  no Postdrome:  no Associated symptoms: Nausea vomiting, photophobia, phonophobia, osmophobia.  She has not had any new worse headache of her life, waking up from sleep Initial duration:  At least 2 days Initial Frequency:  16 headache days per month for several years Triggers:  None Relieving factors:  Rubbing temples, laying in dark quiet cool room Activity:  Cannot function 4 days per month.   Past NSAIDS:  Aleve, diclofenac, Mobic, toradol  Past analgesics:  acetaminophen , Goody, BC, Excedrin Past abortive triptans:  Sumatriptan  tablet, Maxalt, Zomig  5mg  NS (effective) Past muscle relaxants:  tizanidine, cyclobenzaprine  Past anti-emetic:  promethazine Past antihypertensive medications:  no Past antidepressant medications:  Amitriptyline, nortriptyline, citalopram  Past anticonvulsant medications:  topiramate Past vitamins/Herbal/Supplements:  no Other past therapies:  no   Family history of headache/migraine:  mother, maternal grandmother   Reportedly had MRI of brain in 2009 which was negative.  PAST MEDICAL HISTORY: Past Medical History:  Diagnosis Date   Anxiety    Asthma    Bilateral ovarian cysts    Depression    Intractable chronic migraine without aura    neurologist-- dr Janne Members (receives botox  treatment)    MEDICATIONS: Current Outpatient Medications on File Prior to Visit  Medication Sig Dispense Refill   albuterol  (PROVENTIL  HFA;VENTOLIN  HFA) 108 (90 Base) MCG/ACT inhaler Inhale 1-2 puffs into the lungs every 6 (six) hours as needed for wheezing or shortness of breath. 1 Inhaler 3   beclomethasone (QVAR) 40 MCG/ACT inhaler Inhale 2 puffs into the lungs daily. (Patient taking differently: Inhale 2 puffs into the lungs 2 (two) times daily.) 1 Inhaler 3   botulinum toxin Type A  (BOTOX ) 200 units injection Inject 155 units IM into multiple site in the face,neck and head once every 90 days 1 each 4  busPIRone (BUSPAR) 15 MG  tablet Take 15 mg by mouth 2 (two) times daily.     DULoxetine (CYMBALTA) 60 MG capsule Take 60 mg by mouth daily.     ergocalciferol (VITAMIN D2) 1.25 MG (50000 UT) capsule Take by mouth.     montelukast  (SINGULAIR ) 10 MG tablet Take 1 tablet (10 mg total) by mouth at bedtime. 90 tablet 1   ondansetron  (ZOFRAN -ODT) 4 MG disintegrating tablet Take 1 tablet (4 mg total) by mouth every 8 (eight) hours as needed for nausea or vomiting. 20 tablet 5   SIMPESSE 0.15-0.03 &0.01 MG tablet Take 1 tablet by mouth daily.     SUMAtriptan  (IMITREX ) 20 MG/ACT nasal spray Place 1 spray (20 mg total) into the nose as needed for migraine or headache. May repeat in 2 hours if headache persists or recurs.  Maximum 2 sprays in 24 hours. 6 each 5   traZODone  (DESYREL ) 50 MG tablet Take 50 mg by mouth at bedtime.     No current facility-administered medications on file prior to visit.    ALLERGIES: No Known Allergies  FAMILY HISTORY: Family History  Problem Relation Age of Onset   Arthritis Mother    Hypertension Mother    Lupus Mother    Heart disease Maternal Grandmother    Heart disease Maternal Grandfather    Hypertension Maternal Grandfather    Arthritis Paternal Grandmother    Stomach cancer Paternal Grandfather    Ovarian cancer Maternal Aunt    Ovarian cancer Maternal Aunt       Objective:  *** General: No acute distress.  Patient appears well-groomed.   Head:  Normocephalic/atraumatic Eyes:  Fundi examined but not visualized Neck: supple, no paraspinal tenderness, full range of motion Heart:  Regular rate and rhythm Neurological Exam: alert and oriented.  Speech fluent and not dysarthric, language intact.  CN II-XII intact. Bulk and tone normal, muscle strength 5/5 throughout.  Sensation to light touch intact.  Deep tendon reflexes 2+ throughout, toes downgoing.  Finger to nose testing intact.  Gait normal, Romberg negative.   Janne Members, DO  CC: ***

## 2023-08-20 ENCOUNTER — Telehealth: Payer: Self-pay | Admitting: Neurology

## 2023-08-20 ENCOUNTER — Encounter: Payer: Self-pay | Admitting: Neurology

## 2023-08-20 ENCOUNTER — Ambulatory Visit: Payer: Managed Care, Other (non HMO) | Admitting: Neurology

## 2023-08-20 VITALS — BP 116/72 | HR 98 | Ht 67.0 in | Wt 203.0 lb

## 2023-08-20 DIAGNOSIS — G43109 Migraine with aura, not intractable, without status migrainosus: Secondary | ICD-10-CM

## 2023-08-20 MED ORDER — ONDANSETRON 4 MG PO TBDP
4.0000 mg | ORAL_TABLET | Freq: Three times a day (TID) | ORAL | 5 refills | Status: AC | PRN
Start: 2023-08-20 — End: ?

## 2023-08-20 MED ORDER — SUMATRIPTAN 20 MG/ACT NA SOLN: 20.0000 mg | NASAL | 5 refills | Status: AC | PRN

## 2023-08-20 MED ORDER — ZOLMITRIPTAN 5 MG NA SOLN: NASAL | 5 refills | Status: AC

## 2023-08-20 NOTE — Telephone Encounter (Signed)
 Pt. Needs DMV forms filled, left inbox

## 2023-08-21 ENCOUNTER — Other Ambulatory Visit (HOSPITAL_COMMUNITY): Payer: Self-pay

## 2023-08-21 ENCOUNTER — Telehealth: Payer: Self-pay

## 2023-08-21 NOTE — Telephone Encounter (Signed)
 Pharmacy Patient Advocate Encounter   Received notification from Pt Calls Messages that prior authorization for ZOLMitriptan  5MG  solution is required/requested.   Insurance verification completed.   The patient is insured through [facility] .   Per test claim: PA required; PA submitted to above mentioned insurance via CoverMyMeds Key/confirmation #/EOC [id number] Status is pending

## 2023-08-21 NOTE — Telephone Encounter (Signed)
 Pharmacy Patient Advocate Encounter   Received notification from Pt Calls Messages that prior authorization for ZOLMitriptan  5MG  solution is required/requested.   Insurance verification completed.   The patient is insured through [facility] .   Per test claim: PA required; PA started via CoverMyMeds. KEY [id number] . Waiting for clinical questions to populate.

## 2023-08-21 NOTE — Telephone Encounter (Signed)
PA needed for Zomig.

## 2023-08-21 NOTE — Telephone Encounter (Signed)
 On Dr.Jaffe Desk.

## 2023-08-25 NOTE — Telephone Encounter (Signed)
 Pharmacy Patient Advocate Encounter  Received notification from [facility] that Prior Authorization for  ZOLMitriptan  5MG  solution has been APPROVED from 08-25-2023 to 08-23-2024   PA #/Case ID/Reference #: [id number]

## 2023-09-03 DIAGNOSIS — Z0279 Encounter for issue of other medical certificate: Secondary | ICD-10-CM

## 2023-10-30 ENCOUNTER — Ambulatory Visit: Admitting: Neurology

## 2023-10-30 DIAGNOSIS — G43109 Migraine with aura, not intractable, without status migrainosus: Secondary | ICD-10-CM | POA: Diagnosis not present

## 2023-10-30 MED ORDER — ONABOTULINUMTOXINA 100 UNITS IJ SOLR
200.0000 [IU] | Freq: Once | INTRAMUSCULAR | Status: AC
  Administered 2023-10-30: 155 [IU] via INTRAMUSCULAR

## 2023-10-30 NOTE — Progress Notes (Signed)

## 2023-12-09 ENCOUNTER — Telehealth: Payer: Self-pay

## 2023-12-09 NOTE — Telephone Encounter (Signed)
FMLA paperwork received today.

## 2023-12-14 NOTE — Telephone Encounter (Signed)
 Faxed. Mychart message.

## 2023-12-14 NOTE — Telephone Encounter (Signed)
 Pt would like mychart message sent wit confirmation of faxing the paper work

## 2024-01-29 ENCOUNTER — Ambulatory Visit: Admitting: Neurology

## 2024-02-12 ENCOUNTER — Ambulatory Visit (INDEPENDENT_AMBULATORY_CARE_PROVIDER_SITE_OTHER): Admitting: Neurology

## 2024-02-12 DIAGNOSIS — G43709 Chronic migraine without aura, not intractable, without status migrainosus: Secondary | ICD-10-CM | POA: Diagnosis not present

## 2024-02-12 MED ORDER — ONABOTULINUMTOXINA 100 UNITS IJ SOLR
200.0000 [IU] | Freq: Once | INTRAMUSCULAR | Status: AC
  Administered 2024-02-12: 155 [IU] via INTRAMUSCULAR

## 2024-02-12 NOTE — Progress Notes (Signed)

## 2024-05-11 ENCOUNTER — Encounter: Payer: Self-pay | Admitting: Neurology

## 2024-05-12 ENCOUNTER — Other Ambulatory Visit (HOSPITAL_COMMUNITY): Payer: Self-pay

## 2024-05-12 NOTE — Telephone Encounter (Signed)
 Ok to use the shipment that was received. Added new insurance in and did a test bill. These were my findings. Patient must fill with [facility] in [place]. (Will not be able to fill with [facility]) I was going to submit a PA for the next visit, however PA is not required for this medication.

## 2024-05-20 ENCOUNTER — Ambulatory Visit: Admitting: Neurology

## 2024-08-19 ENCOUNTER — Ambulatory Visit: Admitting: Neurology
